# Patient Record
Sex: Female | Born: 1945
Health system: Southern US, Community
[De-identification: ages and names within clinical notes are randomized; demographics above are authoritative.]

## PROBLEM LIST (undated history)

## (undated) DIAGNOSIS — M8580 Other specified disorders of bone density and structure, unspecified site: Secondary | ICD-10-CM

## (undated) DIAGNOSIS — Z9889 Other specified postprocedural states: Secondary | ICD-10-CM

## (undated) DIAGNOSIS — D649 Anemia, unspecified: Secondary | ICD-10-CM

## (undated) DIAGNOSIS — N200 Calculus of kidney: Secondary | ICD-10-CM

## (undated) DIAGNOSIS — N898 Other specified noninflammatory disorders of vagina: Secondary | ICD-10-CM

## (undated) HISTORY — DX: Other specified noninflammatory disorders of vagina: N89.8

## (undated) HISTORY — PX: TONSILLECTOMY: SUR1361

## (undated) HISTORY — DX: Other specified disorders of bone density and structure, unspecified site: M85.80

## (undated) HISTORY — DX: Anemia, unspecified: D64.9

## (undated) HISTORY — PX: HEMORRHOID SURGERY: SHX153

## (undated) HISTORY — PX: COLONOSCOPY: SHX174

## (undated) HISTORY — PX: OTHER SURGICAL HISTORY: SHX169

## (undated) HISTORY — DX: Calculus of kidney: N20.0

## (undated) HISTORY — DX: Other specified postprocedural states: Z98.890

---

## 1993-08-30 HISTORY — PX: CARDIAC SURGERY: SHX584

## 1999-12-17 ENCOUNTER — Other Ambulatory Visit: Admission: RE | Admit: 1999-12-17 | Discharge: 1999-12-17 | Payer: Self-pay | Admitting: Family Medicine

## 2000-04-16 ENCOUNTER — Emergency Department (HOSPITAL_COMMUNITY): Admission: EM | Admit: 2000-04-16 | Discharge: 2000-04-16 | Payer: Self-pay | Admitting: Emergency Medicine

## 2000-04-16 ENCOUNTER — Encounter: Payer: Self-pay | Admitting: Emergency Medicine

## 2001-04-19 ENCOUNTER — Other Ambulatory Visit: Admission: RE | Admit: 2001-04-19 | Discharge: 2001-04-19 | Payer: Self-pay | Admitting: Family Medicine

## 2001-07-19 ENCOUNTER — Encounter: Payer: Self-pay | Admitting: Family Medicine

## 2001-07-19 ENCOUNTER — Encounter: Admission: RE | Admit: 2001-07-19 | Discharge: 2001-07-19 | Payer: Self-pay | Admitting: Family Medicine

## 2002-04-20 ENCOUNTER — Other Ambulatory Visit: Admission: RE | Admit: 2002-04-20 | Discharge: 2002-04-20 | Payer: Self-pay | Admitting: Family Medicine

## 2002-08-30 HISTORY — PX: MITRAL VALVE REPAIR: SHX2039

## 2003-05-17 ENCOUNTER — Other Ambulatory Visit: Admission: RE | Admit: 2003-05-17 | Discharge: 2003-05-17 | Payer: Self-pay | Admitting: Family Medicine

## 2004-02-28 ENCOUNTER — Emergency Department (HOSPITAL_COMMUNITY): Admission: EM | Admit: 2004-02-28 | Discharge: 2004-02-28 | Payer: Self-pay | Admitting: Emergency Medicine

## 2004-07-03 ENCOUNTER — Ambulatory Visit: Payer: Self-pay | Admitting: Family Medicine

## 2004-07-03 ENCOUNTER — Other Ambulatory Visit: Admission: RE | Admit: 2004-07-03 | Discharge: 2004-07-03 | Payer: Self-pay | Admitting: Family Medicine

## 2004-08-12 ENCOUNTER — Ambulatory Visit: Payer: Self-pay | Admitting: Family Medicine

## 2005-07-06 ENCOUNTER — Ambulatory Visit: Payer: Self-pay | Admitting: Family Medicine

## 2005-07-07 ENCOUNTER — Ambulatory Visit: Payer: Self-pay | Admitting: Family Medicine

## 2005-07-09 ENCOUNTER — Ambulatory Visit: Payer: Self-pay | Admitting: Internal Medicine

## 2005-08-10 ENCOUNTER — Ambulatory Visit: Payer: Self-pay | Admitting: Family Medicine

## 2005-08-24 ENCOUNTER — Emergency Department (HOSPITAL_COMMUNITY): Admission: EM | Admit: 2005-08-24 | Discharge: 2005-08-24 | Payer: Self-pay | Admitting: Emergency Medicine

## 2005-12-04 ENCOUNTER — Emergency Department (HOSPITAL_COMMUNITY): Admission: EM | Admit: 2005-12-04 | Discharge: 2005-12-04 | Payer: Self-pay | Admitting: Family Medicine

## 2006-09-08 ENCOUNTER — Ambulatory Visit: Payer: Self-pay | Admitting: Family Medicine

## 2006-09-08 LAB — CONVERTED CEMR LAB
AST: 24 units/L (ref 0–37)
Albumin: 4.2 g/dL (ref 3.5–5.2)
BUN: 11 mg/dL (ref 6–23)
Basophils Absolute: 0 10*3/uL (ref 0.0–0.1)
Calcium: 9.6 mg/dL (ref 8.4–10.5)
Chol/HDL Ratio, serum: 3.2
Cholesterol: 194 mg/dL (ref 0–200)
Eosinophil percent: 2.8 % (ref 0.0–5.0)
GFR calc non Af Amer: 78 mL/min
Glomerular Filtration Rate, Af Am: 94 mL/min/{1.73_m2}
Glucose, Bld: 86 mg/dL (ref 70–99)
HDL: 61.3 mg/dL (ref >39.0)
Lymphocytes Relative: 31.9 % (ref 12.0–46.0)
MCHC: 33.3 g/dL (ref 30.0–36.0)
MCV: 87.1 fL (ref 78.0–100.0)
Monocytes Relative: 8.2 % (ref 3.0–11.0)
Neutro Abs: 2.7 10*3/uL (ref 1.4–7.7)
Platelets: 147 10*3/uL — ABNORMAL LOW (ref 150–400)
Potassium: 5 meq/L (ref 3.5–5.1)
RBC: 4.49 M/uL (ref 3.87–5.11)
Triglyceride fasting, serum: 59 mg/dL (ref 0–149)

## 2006-10-04 ENCOUNTER — Ambulatory Visit: Payer: Self-pay | Admitting: Family Medicine

## 2007-09-08 DIAGNOSIS — M810 Age-related osteoporosis without current pathological fracture: Secondary | ICD-10-CM | POA: Insufficient documentation

## 2007-09-08 DIAGNOSIS — N6019 Diffuse cystic mastopathy of unspecified breast: Secondary | ICD-10-CM

## 2007-09-08 DIAGNOSIS — M8580 Other specified disorders of bone density and structure, unspecified site: Secondary | ICD-10-CM

## 2007-09-08 DIAGNOSIS — Z87442 Personal history of urinary calculi: Secondary | ICD-10-CM

## 2007-09-11 ENCOUNTER — Ambulatory Visit: Payer: Self-pay | Admitting: Family Medicine

## 2007-09-14 LAB — CONVERTED CEMR LAB
AST: 23 units/L (ref 0–37)
Bilirubin, Direct: 0.1 mg/dL (ref 0.0–0.3)
Chloride: 106 meq/L (ref 96–112)
Creatinine, Ser: 0.9 mg/dL (ref 0.4–1.2)
Direct LDL: 130.8 mg/dL
Eosinophils Absolute: 0.1 10*3/uL (ref 0.0–0.6)
Eosinophils Relative: 1.7 % (ref 0.0–5.0)
Glucose, Bld: 95 mg/dL (ref 70–99)
HCT: 40.1 % (ref 36.0–46.0)
Hemoglobin: 13.7 g/dL (ref 12.0–15.0)
MCV: 87.3 fL (ref 78.0–100.0)
Neutrophils Relative %: 58.8 % (ref 43.0–77.0)
RBC: 4.59 M/uL (ref 3.87–5.11)
RDW: 12.3 % (ref 11.5–14.6)
Sodium: 142 meq/L (ref 135–145)
Total Bilirubin: 0.8 mg/dL (ref 0.3–1.2)
Total Protein: 7.4 g/dL (ref 6.0–8.3)
Triglycerides: 69 mg/dL (ref 0–149)
WBC: 4.7 10*3/uL (ref 4.5–10.5)

## 2007-09-23 ENCOUNTER — Encounter: Payer: Self-pay | Admitting: Family Medicine

## 2007-09-28 ENCOUNTER — Encounter: Payer: Self-pay | Admitting: Family Medicine

## 2007-10-03 ENCOUNTER — Ambulatory Visit: Payer: Self-pay | Admitting: Family Medicine

## 2007-10-04 LAB — FECAL OCCULT BLOOD, GUAIAC: Fecal Occult Blood: NEGATIVE

## 2008-08-30 HISTORY — PX: OTHER SURGICAL HISTORY: SHX169

## 2008-09-12 ENCOUNTER — Encounter: Payer: Self-pay | Admitting: Family Medicine

## 2008-09-12 ENCOUNTER — Other Ambulatory Visit: Admission: RE | Admit: 2008-09-12 | Discharge: 2008-09-12 | Payer: Self-pay | Admitting: Family Medicine

## 2008-09-12 ENCOUNTER — Ambulatory Visit: Payer: Self-pay | Admitting: Family Medicine

## 2008-09-12 DIAGNOSIS — E785 Hyperlipidemia, unspecified: Secondary | ICD-10-CM

## 2008-09-16 ENCOUNTER — Encounter (INDEPENDENT_AMBULATORY_CARE_PROVIDER_SITE_OTHER): Payer: Self-pay | Admitting: *Deleted

## 2008-09-16 ENCOUNTER — Encounter: Payer: Self-pay | Admitting: Family Medicine

## 2008-09-16 LAB — CONVERTED CEMR LAB
AST: 27 units/L (ref 0–37)
Albumin: 4.5 g/dL (ref 3.5–5.2)
BUN: 14 mg/dL (ref 6–23)
Basophils Relative: 0.2 % (ref 0.0–3.0)
Chloride: 107 meq/L (ref 96–112)
Creatinine, Ser: 0.8 mg/dL (ref 0.4–1.2)
Direct LDL: 120.8 mg/dL
Eosinophils Absolute: 0.1 10*3/uL (ref 0.0–0.7)
Eosinophils Relative: 1.7 % (ref 0.0–5.0)
GFR calc non Af Amer: 77 mL/min
HCT: 40.5 % (ref 36.0–46.0)
MCV: 89.5 fL (ref 78.0–100.0)
Monocytes Absolute: 0.4 10*3/uL (ref 0.1–1.0)
Neutrophils Relative %: 59.4 % (ref 43.0–77.0)
RBC: 4.52 M/uL (ref 3.87–5.11)
WBC: 5.3 10*3/uL (ref 4.5–10.5)

## 2008-09-19 ENCOUNTER — Encounter (INDEPENDENT_AMBULATORY_CARE_PROVIDER_SITE_OTHER): Payer: Self-pay | Admitting: *Deleted

## 2008-09-21 ENCOUNTER — Encounter: Payer: Self-pay | Admitting: Family Medicine

## 2008-09-25 ENCOUNTER — Encounter (INDEPENDENT_AMBULATORY_CARE_PROVIDER_SITE_OTHER): Payer: Self-pay | Admitting: *Deleted

## 2008-09-27 ENCOUNTER — Ambulatory Visit: Payer: Self-pay | Admitting: Gastroenterology

## 2008-10-11 ENCOUNTER — Ambulatory Visit: Payer: Self-pay | Admitting: Gastroenterology

## 2009-01-08 ENCOUNTER — Ambulatory Visit: Payer: Self-pay | Admitting: Family Medicine

## 2009-01-13 LAB — CONVERTED CEMR LAB
ALT: 19 units/L (ref 0–35)
AST: 24 units/L (ref 0–37)
Cholesterol: 183 mg/dL (ref 0–200)
LDL Cholesterol: 112 mg/dL — ABNORMAL HIGH (ref 0–99)
Total CHOL/HDL Ratio: 3

## 2009-03-08 ENCOUNTER — Emergency Department (HOSPITAL_COMMUNITY): Admission: EM | Admit: 2009-03-08 | Discharge: 2009-03-08 | Payer: Self-pay | Admitting: Emergency Medicine

## 2009-08-28 ENCOUNTER — Encounter: Payer: Self-pay | Admitting: Family Medicine

## 2009-09-11 ENCOUNTER — Telehealth (INDEPENDENT_AMBULATORY_CARE_PROVIDER_SITE_OTHER): Payer: Self-pay | Admitting: *Deleted

## 2009-09-12 ENCOUNTER — Ambulatory Visit: Payer: Self-pay | Admitting: Family Medicine

## 2009-09-14 LAB — CONVERTED CEMR LAB
ALT: 16 units/L (ref 0–35)
AST: 21 units/L (ref 0–37)
Albumin: 4.3 g/dL (ref 3.5–5.2)
Alkaline Phosphatase: 44 units/L (ref 39–117)
Calcium: 8.8 mg/dL (ref 8.4–10.5)
Chloride: 107 meq/L (ref 96–112)
Cholesterol: 172 mg/dL (ref 0–200)
Creatinine, Ser: 0.81 mg/dL (ref 0.40–1.20)
HDL: 61 mg/dL (ref >39)
Total Protein: 7 g/dL (ref 6.0–8.3)
Triglycerides: 60 mg/dL (ref <150)

## 2009-09-15 LAB — CONVERTED CEMR LAB
Basophils Relative: 0.1 % (ref 0.0–3.0)
Eosinophils Absolute: 0.1 10*3/uL (ref 0.0–0.7)
HCT: 40.1 % (ref 36.0–46.0)
Hemoglobin: 13 g/dL (ref 12.0–15.0)
Lymphs Abs: 1.6 10*3/uL (ref 0.7–4.0)
MCHC: 32.5 g/dL (ref 30.0–36.0)
MCV: 90.6 fL (ref 78.0–100.0)
Monocytes Absolute: 0.3 10*3/uL (ref 0.1–1.0)
Neutro Abs: 2.6 10*3/uL (ref 1.4–7.7)
RBC: 4.42 M/uL (ref 3.87–5.11)
Vit D, 25-Hydroxy: 19 ng/mL — ABNORMAL LOW (ref 30–89)

## 2009-09-23 ENCOUNTER — Ambulatory Visit: Payer: Self-pay | Admitting: Family Medicine

## 2009-09-23 DIAGNOSIS — D696 Thrombocytopenia, unspecified: Secondary | ICD-10-CM

## 2009-09-23 DIAGNOSIS — E559 Vitamin D deficiency, unspecified: Secondary | ICD-10-CM

## 2009-09-24 ENCOUNTER — Ambulatory Visit: Payer: Self-pay | Admitting: Oncology

## 2009-09-30 ENCOUNTER — Encounter: Payer: Self-pay | Admitting: Family Medicine

## 2009-09-30 LAB — LACTATE DEHYDROGENASE: LDH: 151 U/L (ref 94–250)

## 2009-09-30 LAB — CBC WITH DIFFERENTIAL (CANCER CENTER ONLY)
Eosinophils Absolute: 0.1 10*3/uL (ref 0.0–0.5)
HCT: 40.3 % (ref 34.8–46.6)
LYMPH#: 1.9 10*3/uL (ref 0.9–3.3)
LYMPH%: 32.6 % (ref 14.0–48.0)
MCV: 87 fL (ref 81–101)
MONO#: 0.4 10*3/uL (ref 0.1–0.9)
Platelets: 144 10*3/uL — ABNORMAL LOW (ref 145–400)
RBC: 4.65 10*6/uL (ref 3.70–5.32)
WBC: 5.8 10*3/uL (ref 3.9–10.0)

## 2009-09-30 LAB — FERRITIN: Ferritin: 32 ng/mL (ref 10–291)

## 2009-09-30 LAB — CMP (CANCER CENTER ONLY)
ALT(SGPT): 21 U/L (ref 10–47)
AST: 23 U/L (ref 11–38)
Alkaline Phosphatase: 56 U/L (ref 26–84)
CO2: 30 mEq/L (ref 18–33)
Creat: 0.9 mg/dl (ref 0.6–1.2)
Sodium: 140 mEq/L (ref 128–145)
Total Bilirubin: 0.5 mg/dl (ref 0.20–1.60)
Total Protein: 7.5 g/dL (ref 6.4–8.1)

## 2009-09-30 LAB — MORPHOLOGY - CHCC SATELLITE
PLT EST ~~LOC~~: ADEQUATE
Platelet Morphology: NORMAL
RBC Comments: NORMAL

## 2009-09-30 LAB — IRON AND TIBC: Iron: 84 ug/dL (ref 42–145)

## 2009-10-04 ENCOUNTER — Encounter: Payer: Self-pay | Admitting: Family Medicine

## 2009-10-04 LAB — HM MAMMOGRAPHY: HM Mammogram: NORMAL

## 2009-10-10 ENCOUNTER — Encounter (INDEPENDENT_AMBULATORY_CARE_PROVIDER_SITE_OTHER): Payer: Self-pay | Admitting: *Deleted

## 2009-12-15 ENCOUNTER — Ambulatory Visit: Payer: Self-pay | Admitting: Family Medicine

## 2009-12-16 ENCOUNTER — Encounter: Payer: Self-pay | Admitting: Family Medicine

## 2009-12-26 ENCOUNTER — Ambulatory Visit: Payer: Self-pay | Admitting: Oncology

## 2009-12-31 ENCOUNTER — Encounter: Payer: Self-pay | Admitting: Family Medicine

## 2009-12-31 LAB — CBC WITH DIFFERENTIAL (CANCER CENTER ONLY)
BASO#: 0 10*3/uL (ref 0.0–0.2)
Eosinophils Absolute: 0.1 10*3/uL (ref 0.0–0.5)
HCT: 38.6 % (ref 34.8–46.6)
HGB: 13.1 g/dL (ref 11.6–15.9)
LYMPH%: 36.9 % (ref 14.0–48.0)
MCH: 28.8 pg (ref 26.0–34.0)
MCV: 85 fL (ref 81–101)
MONO%: 6 % (ref 0.0–13.0)
RBC: 4.55 10*6/uL (ref 3.70–5.32)

## 2009-12-31 LAB — MORPHOLOGY - CHCC SATELLITE

## 2010-02-03 ENCOUNTER — Telehealth: Payer: Self-pay | Admitting: Internal Medicine

## 2010-03-27 ENCOUNTER — Ambulatory Visit: Payer: Self-pay | Admitting: Oncology

## 2010-04-02 LAB — CBC WITH DIFFERENTIAL (CANCER CENTER ONLY)
BASO#: 0 10*3/uL (ref 0.0–0.2)
Eosinophils Absolute: 0.3 10*3/uL (ref 0.0–0.5)
HGB: 12.4 g/dL (ref 11.6–15.9)
LYMPH#: 1.5 10*3/uL (ref 0.9–3.3)
MCH: 29.2 pg (ref 26.0–34.0)
MONO#: 0.4 10*3/uL (ref 0.1–0.9)
NEUT#: 3.3 10*3/uL (ref 1.5–6.5)
RBC: 4.25 10*6/uL (ref 3.70–5.32)
WBC: 5.4 10*3/uL (ref 3.9–10.0)

## 2010-06-26 ENCOUNTER — Ambulatory Visit: Payer: Self-pay | Admitting: Oncology

## 2010-07-01 ENCOUNTER — Encounter: Payer: Self-pay | Admitting: Family Medicine

## 2010-07-01 LAB — CBC WITH DIFFERENTIAL (CANCER CENTER ONLY)
BASO#: 0 10*3/uL (ref 0.0–0.2)
BASO%: 0.7 % (ref 0.0–2.0)
EOS%: 2.1 % (ref 0.0–7.0)
HCT: 39 % (ref 34.8–46.6)
HGB: 13.1 g/dL (ref 11.6–15.9)
LYMPH%: 30.1 % (ref 14.0–48.0)
MCH: 28.6 pg (ref 26.0–34.0)
MCHC: 33.6 g/dL (ref 32.0–36.0)
MCV: 85 fL (ref 81–101)
NEUT%: 59.1 % (ref 39.6–80.0)
RDW: 12.8 % (ref 10.5–14.6)

## 2010-07-01 LAB — MORPHOLOGY - CHCC SATELLITE: PLT EST ~~LOC~~: ADEQUATE

## 2010-07-01 LAB — CHCC SATELLITE - SMEAR

## 2010-09-21 ENCOUNTER — Telehealth (INDEPENDENT_AMBULATORY_CARE_PROVIDER_SITE_OTHER): Payer: Self-pay | Admitting: *Deleted

## 2010-09-22 ENCOUNTER — Other Ambulatory Visit: Payer: Self-pay | Admitting: Family Medicine

## 2010-09-22 ENCOUNTER — Encounter: Payer: Self-pay | Admitting: Family Medicine

## 2010-09-22 ENCOUNTER — Ambulatory Visit
Admission: RE | Admit: 2010-09-22 | Discharge: 2010-09-22 | Payer: Self-pay | Source: Home / Self Care | Attending: Family Medicine | Admitting: Family Medicine

## 2010-09-22 LAB — BASIC METABOLIC PANEL
BUN: 15 mg/dL (ref 6–23)
CO2: 29 mEq/L (ref 19–32)
Calcium: 9.2 mg/dL (ref 8.4–10.5)
Chloride: 107 mEq/L (ref 96–112)
Creatinine, Ser: 0.9 mg/dL (ref 0.4–1.2)
GFR: 64.42 mL/min (ref >60.00)
Glucose, Bld: 92 mg/dL (ref 70–99)
Potassium: 4.9 mEq/L (ref 3.5–5.1)
Sodium: 141 mEq/L (ref 135–145)

## 2010-09-22 LAB — CBC WITH DIFFERENTIAL/PLATELET
Basophils Absolute: 0 10*3/uL (ref 0.0–0.1)
Eosinophils Relative: 1.6 % (ref 0.0–5.0)
HCT: 39.4 % (ref 36.0–46.0)
Lymphs Abs: 1.8 10*3/uL (ref 0.7–4.0)
MCV: 89.1 fl (ref 78.0–100.0)
Monocytes Absolute: 0.4 10*3/uL (ref 0.1–1.0)
Neutrophils Relative %: 52.3 % (ref 43.0–77.0)
Platelets: 146 10*3/uL — ABNORMAL LOW (ref 150.0–400.0)
RDW: 13.7 % (ref 11.5–14.6)
WBC: 4.9 10*3/uL (ref 4.5–10.5)

## 2010-09-22 LAB — LIPID PANEL
Cholesterol: 205 mg/dL — ABNORMAL HIGH (ref 0–200)
HDL: 60.8 mg/dL (ref >39.00)
Total CHOL/HDL Ratio: 3
Triglycerides: 88 mg/dL (ref 0.0–149.0)
VLDL: 17.6 mg/dL (ref 0.0–40.0)

## 2010-09-22 LAB — HEPATIC FUNCTION PANEL
Bilirubin, Direct: 0.1 mg/dL (ref 0.0–0.3)
Total Bilirubin: 0.5 mg/dL (ref 0.3–1.2)

## 2010-09-22 LAB — TSH: TSH: 1.2 u[IU]/mL (ref 0.35–5.50)

## 2010-09-22 LAB — LDL CHOLESTEROL, DIRECT: Direct LDL: 127.8 mg/dL

## 2010-09-23 LAB — CONVERTED CEMR LAB: Vit D, 25-Hydroxy: 30 ng/mL (ref 30–89)

## 2010-09-25 ENCOUNTER — Ambulatory Visit
Admission: RE | Admit: 2010-09-25 | Discharge: 2010-09-25 | Payer: Self-pay | Source: Home / Self Care | Attending: Family Medicine | Admitting: Family Medicine

## 2010-09-29 NOTE — Miscellaneous (Signed)
Summary: Vitamin D 2000iu  Clinical Lists Changes  Medications: Removed medication of ERGOCALCIFEROL 50000 UNIT CAPS (ERGOCALCIFEROL) 1 by mouth each week for 12 weeks Added new medication of VITAMIN D 2000 UNIT TABS (CHOLECALCIFEROL) Take 1 tablet by mouth once a day     Current Allergies: No known allergies

## 2010-09-29 NOTE — Consult Note (Signed)
[facility]   Imported By: Lanelle Bal 10/07/2009 12:56:09  _____________________________________________________________________  External Attachment:    Type:   Image     Comment:   External Document

## 2010-09-29 NOTE — Assessment & Plan Note (Signed)
Summary: Complete Physical   Vital Signs:  Patient profile:   65 year old female Height:      61.5 inches Weight:      130 pounds BMI:     24.25 Temp:     97.7 degrees F oral Pulse rate:   80 / minute Pulse rhythm:   regular BP sitting:   128 / 70  (left arm) Cuff size:   regular  Vitals Entered By: Lowella Petties CMA (September 23, 2009 11:06 AM) CC: 30 minute check up   History of Present Illness: here for health mt exam and to rev chronic med problems  is doing well as usual   had a dog bite this year - from her own dog  had to have a skin graft  got a Td in hospital  is healing well    wt is down 3 lb  bp is 128/76-- good   hx of anemia - and that ok but low platelets  no easy bruising or bleeding  was 135 last year- now 118  she takes asa every other day   lipids good trig 60, wiht HDL 61  and LDL 99 -- very good  takes flax seed oil    Osteopenia imp at last dexa 1 y ago ca and D - is not good about it  D level is low at 19   colonosc utd 2/10-- 5 y f/u   pap nl 1/10 no hx of abn ones  no gyn symptoms and her vaginal cyst is not changed   mam 1/10-- she will schedule her own self exam  no lumps   Td 7/10  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    Allergies: No Known Drug Allergies  Past History:  Past Medical History: Last updated: 10/14/2008 Anemia-NOS Nephrolithiasis, hx of Osteoporosis vaginal cyst- stable hx of mitral valve repair-- proph for dentist    cardiol- [facility]  Family History: Last updated: 09/11/2007 Father:  Mother: abnormal colon polyp Siblings: brother with DM uncle with prostate CA aunt sudden death  Social History: Last updated: 09/12/2008 Marital Status: Married Children: 3 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  Risk Factors: Smoking Status: never (09/08/2007)  Past Surgical History: Heart surgery- mitral valve repair- carpenter's ring (1995) Lithotripsy for kidney  stones Tonsillectomy Surgery for ruptured ovarian cyst Old records state 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 Eyes:  Denies blurring and eye pain. ENT:  Complains of nosebleeds. CV:  Denies chest pain or discomfort, lightheadness, and palpitations. Resp:  Denies cough and wheezing. GI:  Denies abdominal pain, bloody stools, change in bowel habits, indigestion, nausea, and vomiting. GU:  Denies abnormal vaginal bleeding, discharge, and dysuria. MS:  Denies joint pain and muscle aches. Derm:  Denies itching, lesion(s), poor wound healing, and rash. Neuro:  Denies headaches, numbness, and tingling. Psych:  Denies anxiety and depression. Endo:  Denies cold intolerance, excessive thirst, excessive urination, and heat intolerance. Heme:  Denies abnormal bruising, bleeding, enlarge lymph nodes, pallor, and skin discoloration.  Physical Exam  General:  Well-developed,well-nourished,in no acute distress; alert,appropriate  and cooperative throughout examination Head:  normocephalic, atraumatic, and no abnormalities observed.   Eyes:  vision grossly intact, pupils equal, pupils round, and pupils reactive to light.   Neck:  supple with full rom and no masses or thyromegally, no JVD or carotid bruit  Breasts:  No mass, nodules, thickening, tenderness, bulging, retraction, inflamation, nipple discharge or skin changes noted.   Lungs:  Normal respiratory effort, chest expands symmetrically. Lungs are clear to auscultation, no crackles or  wheezes. Heart:  Normal rate and regular rhythm. S1 and S2 normal without gallop, murmur, click, rub or other extra sounds. Abdomen:  Bowel sounds positive,abdomen soft and non-tender without masses, organomegaly or hernias noted. no renal bruits  Msk:  No deformity or scoliosis noted of thoracic or lumbar spine.  no acute joint changes  Pulses:  R and L carotid,radial,femoral,dorsalis pedis and posterior tibial pulses are full and equal bilaterally Extremities:  No clubbing, cyanosis, edema, or deformity noted with normal full range of motion of all joints.   Neurologic:  sensation intact to light touch, gait normal, and DTRs symmetrical and normal.   Skin:  Intact without suspicious lesions or rashes fair with some lentigos no bruising or petichae Cervical Nodes:  No lymphadenopathy noted Axillary Nodes:  No palpable lymphadenopathy Inguinal Nodes:  No significant adenopathy Psych:  normal affect, talkative and pleasant    Impression & Recommendations:  Problem # 1:  HEALTH MAINTENANCE EXAM (ICD-V70.0) Assessment Comment Only reviewed health habits including diet, exercise and skin cancer prevention reviewed health maintenance list and family history  rev labs  urged to keep up good habits   Problem # 2:  UNSPECIFIED VITAMIN D DEFICIENCY (ICD-268.9) Assessment: New will tx with high dose weekly D for 12 weeks and re check also 1000 international units otc daily stressed imp for bone health  Problem # 3:  THROMBOCYTOPENIA (ICD-287.5) Assessment: New  new with platelet ct 118 and no symptoms  ref to heme   Orders: Hematology Referral (Hematology)  Problem # 4:  OSTEOPOROSIS (ICD-733.00) Assessment: Unchanged dexa utd repl D  fosamax refil  Her updated medication list for this problem includes:    Fosamax 70 Mg Tabs (Alendronate sodium) .Marland Kitchen... 1 by mouth q week as directed    Ergocalciferol 50000 Unit Caps (Ergocalciferol) .Marland Kitchen... 1 by mouth each week for 12  weeks  Complete Medication List: 1)  Fosamax 70 Mg Tabs (Alendronate sodium) .Marland Kitchen.. 1 by mouth q week as directed 2)  Aspirin 325 Mg Tabs (Aspirin) .... One by mouth qod 3)  Flax Seed Tabs  .... One by mouth daily 4)  Amoxicillin 500 Mg Caps (Amoxicillin) .... 4 pills by mouth times one thirty minutes before dentist appt 5)  Ergocalciferol 50000 Unit Caps (Ergocalciferol) .Marland Kitchen.. 1 by mouth each week for 12 weeks  Patient Instructions: 1)  the current recommendation for calcium intake is 1200-1500 mg daily with 1000 IU of vitamin D  2)  take px vit D as directed  3)  schedule non fasting labs in 12 weeks to check vit D level  4)  we will refer you to hematology for platelet problem at check out  5)  make sure to schedule your annual mammogram  6)  flu shot  Prescriptions: FOSAMAX 70 MG TABS (ALENDRONATE SODIUM) 1 by mouth q week as directed  #4 x 11   Entered and Authorized by:   Judith Part MD   Signed by:   Judith Part MD on 09/23/2009  Method used:   Print then Give to Patient   RxID:   [id number] ERGOCALCIFEROL 50000 UNIT CAPS (ERGOCALCIFEROL) 1 by mouth each week for 12 weeks  #12 x 0   Entered and Authorized by:   Judith Part MD   Signed by:   Judith Part MD on 09/23/2009   Method used:   Print then Give to Patient   RxID:   [id number]   Prior Medications (reviewed today): FOSAMAX 70 MG TABS (ALENDRONATE SODIUM) 1 by mouth q week as directed ASPIRIN 325 MG  TABS (ASPIRIN) one by mouth qod FLAX SEED TABS () one by mouth daily AMOXICILLIN 500 MG CAPS (AMOXICILLIN) 4 pills by mouth times one thirty minutes before dentist appt ERGOCALCIFEROL 50000 UNIT CAPS (ERGOCALCIFEROL) 1 by mouth each week for 12 weeks Current Allergies: No known allergies      Tetanus/Td Immunization History:    Tetanus/Td # 1:  Td (02/27/2009)

## 2010-09-29 NOTE — Miscellaneous (Signed)
  Clinical Lists Changes  Observations: Added new observation of MAMMO DUE: 10/04/2010 (10/04/2009 9:00) Added new observation of MAMMOGRAM: Normal (10/04/2009 9:00)

## 2010-09-29 NOTE — Letter (Signed)
Summary: [facility]  [facility]   Imported By: Lanelle Bal 09/02/2009 10:01:00  _____________________________________________________________________  External Attachment:    Type:   Image     Comment:   External Document

## 2010-09-29 NOTE — Progress Notes (Signed)
Summary: lab orders for 09/12/09 appt  ---- Converted from flag ---- ---- 09/11/2009 8:22 AM, Judith Part MD wrote: please check wellness prof/ lipids and vit D level - thanks   ---- 09/11/2009 8:18 AM, Liane Comber CMA (AAMA) wrote: Pt is scheduled for cpx labs tomorrow, what labs to draw and dx codes? Thanks Tasha ------------------------------

## 2010-09-29 NOTE — Letter (Signed)
[facility]   Imported By: Lanelle Bal 01/08/2010 14:10:07  _____________________________________________________________________  External Attachment:    Type:   Image     Comment:   External Document

## 2010-09-29 NOTE — Letter (Signed)
Summary: [facility]  [facility]   Imported By: Lanelle Bal 07/13/2010 12:10:07  _____________________________________________________________________  External Attachment:    Type:   Image     Comment:   External Document

## 2010-09-29 NOTE — Letter (Signed)
Summary: Results Follow up Letter  [place] [facility]  [address] [place], [place] [address]   Phone: [phone]  Fax: [phone]    10/10/2009 MRN: [id number]    DHANVI BOESEN [address] [place] [place], [place]  [address]    Dear Ms. Volcy,  The following are the results of your recent test(s):  Test         Result    Pap Smear:        Normal _____  Not Normal _____ Comments: ______________________________________________________ Cholesterol: LDL(Bad cholesterol):         Your goal is less than:         HDL (Good cholesterol):       Your goal is more than: Comments:  ______________________________________________________ Mammogram:        Normal __X___  Not Normal _____ Comments:  Yearly follow up is recommended.   ___________________________________________________________________ Hemoccult:        Normal _____  Not normal _______ Comments:    _____________________________________________________________________ Other Tests:    We routinely do not discuss normal results over the telephone.  If you desire a copy of the results, or you have any questions about this information we can discuss them at your next office visit.   Sincerely,   Marne A. Milinda Antis, M.D.  MAT:lsf

## 2010-09-29 NOTE — Progress Notes (Signed)
Summary: Amoxicillin for dental work  Phone Note Refill Request Message from:  [employer] on February 03, 2010 10:24 AM  Refills Requested: Medication #1:  AMOXICILLIN 500 MG CAPS 4 pills by mouth times one thirty minutes before dentist appt [facility]  [address]  [phone]*   Last Fill Date:  No date sent   Pharmacy Phone:  [phone]   Method Requested: Electronic Initial call taken by: Delilah Shan CMA Duncan Dull),  February 03, 2010 10:24 AM  Follow-up for Phone Call        okay to refill #4 x 1 refill Follow-up by: Cindee Salt MD,  February 03, 2010 11:13 AM  Additional Follow-up for Phone Call Additional follow up Details #1::        Rx faxed to pharmacy Additional Follow-up by: DeShannon Smith CMA Duncan Dull),  February 03, 2010 2:35 PM    Prescriptions: AMOXICILLIN 500 MG CAPS (AMOXICILLIN) 4 pills by mouth times one thirty minutes before dentist appt  #4 x 1   Entered by:   Mervin Hack CMA (AAMA)   Authorized by:   Cindee Salt MD   Signed by:   Mervin Hack CMA (AAMA) on 02/03/2010   Method used:   Electronically to        [facility]  [phone]* (retail)       [address] [place], [place]  [address]       Ph: [phone] or [phone]       Fax: [phone]   RxID:   [id number]

## 2010-10-01 NOTE — Progress Notes (Signed)
----   Converted from flag ---- ---- 09/20/2010 9:34 AM, Colon Flattery Tower MD wrote: please check wellness and lipid and D level  v70.0 and 272 and D deficiency and 733.0 thanks  ---- 09/18/2010 11:36 AM, Liane Comber CMA (AAMA) wrote: Lab orders please! Good Morning! This pt is scheduled for cpx labs Tuesday, which labs to draw and dx codes to use? Thanks Tasha ------------------------------

## 2010-10-01 NOTE — Assessment & Plan Note (Signed)
Summary: CPX/CLE   Vital Signs:  Patient profile:   65 year old female Height:      62.5 inches Weight:      137.25 pounds BMI:     24.79 Temp:     97.9 degrees F oral Pulse rate:   84 / minute Pulse rhythm:   regular BP sitting:   116 / 68  (left arm) Cuff size:   regular  Vitals Entered By: Lewanda Rife LPN (September 25, 2010 10:16 AM) CC: CPX   History of Present Illness: here for health mt exam and to review chronic med problems has been feeling ok- no new health problems   wt is up 7lb with bmi of 24  116/68 bp   osteopenia - dexa 1/10 improved vit /d level down a bit at 30  - takes 2000 international units daily  takes calcium gets some exercise  on her toes - 3 toddlers in her house  is very active  ? 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 Cardiac cath-(1994) Hemorrhoidectomy Left breast aspiration Cyst- vaginal vault, stable (12/1999) Dexa0 osteoporosis (04/2001),  improved (05/2003), stable (06/2005) Mitral valve replacement (07/2003) dexa 1/10 osteopenia- improved  2/10 colonoscopy diverticulosis - re check 5 years (due to fam hx) skin graft from dog bite R arm 2010  Family History: Last updated: 09/11/2007 Father:  Mother: abnormal colon polyp Siblings: brother with DM uncle with prostate CA aunt sudden death  Social History: Last  updated: 09/12/2008 Marital Status: Married Children: 3 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 Eyes:  Denies blurring and eye irritation. CV:  Denies chest pain or discomfort, lightheadness, and palpitations. Resp:  Denies cough, shortness of breath, and wheezing. GI:  Denies abdominal pain, change in bowel habits, indigestion, and nausea. GU:  Denies abnormal vaginal bleeding, discharge, dysuria, and urinary frequency. MS:  Denies joint pain, joint redness, joint swelling, and stiffness. Derm:  Denies itching, lesion(s), poor wound healing, and rash. Neuro:  Denies numbness and tingling. Psych:  Denies anxiety and depression. Endo:  Denies cold intolerance, excessive thirst, excessive urination, and heat intolerance. Heme:  Denies abnormal bruising and bleeding.  Physical Exam  General:  Well-developed,well-nourished,in no acute distress; alert,appropriate and cooperative throughout examination Head:  normocephalic, atraumatic, and no abnormalities observed.   Eyes:  vision grossly intact, pupils equal, pupils round, and pupils reactive to light.  no conjunctival pallor, injection or icterus  Ears:  R ear normal and L ear normal.   Nose:  no nasal discharge.   Mouth:  pharynx pink and moist.   Neck:  supple with full rom and no masses or thyromegally, no JVD or carotid bruit  Chest Wall:  No deformities, masses, or tenderness noted. Breasts:  No mass, nodules, thickening, tenderness, bulging, retraction, inflamation, nipple discharge or skin changes noted.   Lungs:  Normal respiratory effort, chest expands symmetrically. Lungs are clear to  auscultation, no crackles or wheezes. Heart:  RRR with baseline M Abdomen:  Bowel sounds positive,abdomen soft and non-tender without masses, organomegaly or hernias noted. no renal bruits  Msk:  no kyphosis petite frame  Pulses:  R and L carotid,radial,femoral,dorsalis pedis and posterior tibial pulses are full and equal bilaterally Extremities:  No clubbing, cyanosis, edema, or deformity noted with normal full range of motion of all joints.   Neurologic:  sensation intact to light touch, gait normal, and DTRs symmetrical and normal.   Skin:  bruise under R 1st toenail - pt describes trauma  Cervical Nodes:  No lymphadenopathy noted Axillary Nodes:  No palpable lymphadenopathy Inguinal Nodes:  No significant adenopathy Psych:  normal affect, talkative and pleasant    Impression & Recommendations:  Problem # 1:  HEALTH MAINTENANCE EXAM (ICD-V70.0) Assessment Comment Only reviewed health habits including diet, exercise and skin cancer prevention reviewed health maintenance list and family history labs rev in detail with pt   Problem # 2:  UNSPECIFIED VITAMIN D DEFICIENCY (ICD-268.9) Assessment: Deteriorated vit D down a bit inc otc to 4000 international units daily  re check next year  Problem # 3:  OTHER SCREENING MAMMOGRAM (ICD-V76.12) Assessment: Comment Only pt will schedule her own  nl exam today enc regular self exams  Problem # 4:  HYPERLIPIDEMIA (ICD-272.4) Assessment: Deteriorated  labs show inc LDL rev low sat fat diet to follow keep up activity  Labs Reviewed: SGOT: 25 (09/22/2010)   SGPT: 23 (09/22/2010)   HDL:60.80 (09/22/2010), 61 (09/12/2009)  LDL:99 (09/12/2009), 112 (16/05/9603)  Chol:205 (09/22/2010), 172 (09/12/2009)  Trig:88.0 (09/22/2010), 60 (09/12/2009)  Problem # 5:  OSTEOPOROSIS (ICD-733.00) Assessment: Unchanged on fosamax over 5 years- adv to stop it  continue ca and D pt will schedule her 2 y dexa  The following medications were removed  from the medication list:    Fosamax 70 Mg Tabs (Alendronate sodium) .Marland Kitchen... 1 by mouth q week as directed Her updated medication list for this problem includes:    Vitamin D 2000 Unit Tabs (Cholecalciferol) .Marland Kitchen... Take 2  tablet by mouth once a day  Complete Medication List: 1)  Aspirin 325 Mg Tabs (Aspirin) .... One by mouth qod 2)  Amoxicillin 500 Mg Caps (Amoxicillin) .... 4 pills by mouth times one thirty minutes before dentist appt 3)  Vitamin D 2000 Unit Tabs (Cholecalciferol) .... Take 2  tablet by mouth once a day 4)  Flax Seed Oil 1000 Mg Caps (Flaxseed (linseed)) .... Take 1 capsule by mouth once a day  Patient Instructions: 1)  please increase your vit D3 to 4000 international units daily total  2)  don't forget to schedule your annual mammogram and bone density test  3)  you can raise your HDL (good cholesterol) by increasing exercise and eating omega 3 fatty acid supplement like fish oil or flax seed oil over the counter 4)  you can lower LDL (bad cholesterol) by limiting saturated fats in diet like red meat, fried foods, egg yolks, fatty breakfast meats, high fat dairy products and shellfish  5)  get a flu  shot at a pharmacy since we were out of them  6)  if you are interested in a shingles vacine - check with insurance and then call us to schedule  7)  please pull patient's old chart and put it on my desk- thanks    Orders Added: 1)  Est. Patient 40-64 years [phone]    Current Allergies (reviewed today): No known allergies

## 2010-12-11 ENCOUNTER — Telehealth: Payer: Self-pay | Admitting: *Deleted

## 2010-12-11 NOTE — Telephone Encounter (Signed)
Pt states she has had cough and cold symptoms. Had this about 2 weeks ago, improved, but now it has come back.  She started taking tylenol cold and feels pretty good today.  She will see how she does and if not good tomorrow she will go to urgent care or Saturday clinic.

## 2010-12-11 NOTE — Telephone Encounter (Signed)
Patient notified as instructed by telephone. 

## 2010-12-11 NOTE — Telephone Encounter (Signed)
I agree- needs to follow up if not improved  If worse - seek care now

## 2010-12-25 LAB — HM MAMMOGRAPHY: HM Mammogram: NEGATIVE

## 2010-12-25 LAB — HM DEXA SCAN

## 2011-02-11 ENCOUNTER — Telehealth: Payer: Self-pay

## 2011-02-11 NOTE — Telephone Encounter (Signed)
Could not add dexa without opening phone call would not let me do quick abstraction or override in health maintenance.

## 2011-02-15 ENCOUNTER — Other Ambulatory Visit: Payer: Self-pay | Admitting: *Deleted

## 2011-02-15 MED ORDER — AMOXICILLIN 500 MG PO CAPS: ORAL_CAPSULE | ORAL | Status: DC

## 2011-02-15 NOTE — Telephone Encounter (Signed)
Rx called to pharmacy

## 2011-02-15 NOTE — Telephone Encounter (Signed)
Px written for call in   

## 2011-10-06 ENCOUNTER — Telehealth: Payer: Self-pay | Admitting: Family Medicine

## 2011-10-06 ENCOUNTER — Other Ambulatory Visit (INDEPENDENT_AMBULATORY_CARE_PROVIDER_SITE_OTHER): Payer: 59

## 2011-10-06 DIAGNOSIS — E559 Vitamin D deficiency, unspecified: Secondary | ICD-10-CM

## 2011-10-06 DIAGNOSIS — M810 Age-related osteoporosis without current pathological fracture: Secondary | ICD-10-CM

## 2011-10-06 DIAGNOSIS — E785 Hyperlipidemia, unspecified: Secondary | ICD-10-CM

## 2011-10-06 DIAGNOSIS — D696 Thrombocytopenia, unspecified: Secondary | ICD-10-CM

## 2011-10-06 LAB — CBC WITH DIFFERENTIAL/PLATELET
Basophils Absolute: 0 10*3/uL (ref 0.0–0.1)
Eosinophils Absolute: 0.1 10*3/uL (ref 0.0–0.7)
Lymphocytes Relative: 36.8 % (ref 12.0–46.0)
MCHC: 33.8 g/dL (ref 30.0–36.0)
Monocytes Relative: 8.7 % (ref 3.0–12.0)
Neutrophils Relative %: 52.3 % (ref 43.0–77.0)
Platelets: 137 10*3/uL — ABNORMAL LOW (ref 150.0–400.0)
RDW: 13.3 % (ref 11.5–14.6)

## 2011-10-06 LAB — LIPID PANEL
HDL: 68.6 mg/dL (ref >39.00)
Total CHOL/HDL Ratio: 3
VLDL: 15.8 mg/dL (ref 0.0–40.0)

## 2011-10-06 LAB — COMPREHENSIVE METABOLIC PANEL
ALT: 21 U/L (ref 0–35)
AST: 25 U/L (ref 0–37)
CO2: 27 mEq/L (ref 19–32)
Calcium: 9.5 mg/dL (ref 8.4–10.5)
Chloride: 107 mEq/L (ref 96–112)
Creatinine, Ser: 1 mg/dL (ref 0.4–1.2)
GFR: 59.75 mL/min — ABNORMAL LOW (ref >60.00)
Sodium: 141 mEq/L (ref 135–145)
Total Protein: 7.4 g/dL (ref 6.0–8.3)

## 2011-10-06 NOTE — Telephone Encounter (Signed)
Message copied by Judy Pimple on Wed Oct 06, 2011  8:52 AM ------      Message from: Alvina Chou      Created: Tue Sep 28, 2011  3:26 PM      Regarding: Lab orders for 2.6.13       Patient is scheduled for CPX labs, please order future labs, Thanks , Camelia Eng

## 2011-10-13 ENCOUNTER — Other Ambulatory Visit (HOSPITAL_COMMUNITY)
Admission: RE | Admit: 2011-10-13 | Discharge: 2011-10-13 | Disposition: A | Discharge status: Home / Self Care | Payer: 59 | Source: Ambulatory Visit | Attending: Family Medicine | Admitting: Family Medicine

## 2011-10-13 ENCOUNTER — Encounter: Payer: Self-pay | Admitting: Family Medicine

## 2011-10-13 ENCOUNTER — Ambulatory Visit (INDEPENDENT_AMBULATORY_CARE_PROVIDER_SITE_OTHER): Payer: 59 | Admitting: Family Medicine

## 2011-10-13 VITALS — BP 116/68 | HR 84 | Temp 98.1°F | Ht 62.5 in | Wt 136.8 lb

## 2011-10-13 DIAGNOSIS — M810 Age-related osteoporosis without current pathological fracture: Secondary | ICD-10-CM

## 2011-10-13 DIAGNOSIS — Z1159 Encounter for screening for other viral diseases: Secondary | ICD-10-CM | POA: Insufficient documentation

## 2011-10-13 DIAGNOSIS — E559 Vitamin D deficiency, unspecified: Secondary | ICD-10-CM

## 2011-10-13 DIAGNOSIS — D696 Thrombocytopenia, unspecified: Secondary | ICD-10-CM

## 2011-10-13 DIAGNOSIS — E785 Hyperlipidemia, unspecified: Secondary | ICD-10-CM

## 2011-10-13 DIAGNOSIS — Z23 Encounter for immunization: Secondary | ICD-10-CM

## 2011-10-13 DIAGNOSIS — Z01419 Encounter for gynecological examination (general) (routine) without abnormal findings: Secondary | ICD-10-CM | POA: Insufficient documentation

## 2011-10-13 NOTE — Patient Instructions (Signed)
Don't forget to schedule your own mammogram for April Try to get 1200-1500 mg of calcium per day with at least 1000 iu of vitamin D - for bone health  In addition to that continue 2000 iu vit D daily  Try to exercise 5 days per week If you are interested in a shingles/zoster vaccine - call your insurance to check on coverage,( you should not get it within 1 month of other vaccines) , then call us for a prescription  for it to take to a pharmacy that gives the shot or get it here Pneumonia vaccine today

## 2011-10-13 NOTE — Progress Notes (Signed)
Subjective:    Patient ID: Angelica Rivers, female    DOB: [birth year], 66 y.o.   MRN: [id number]  HPI Here for check up of chronic medical conditions and to review health mt list   Nothing new - feels good   bp good 116/84 Wt is stable with bmi of 24  Pap 1/10-- was normal  No hx of abn paps  Will do today  Has a vaginal cyst -- does not change   mammo 4/12- she will sched own mammo Self exam- no lumps or changes   OP- last dexa 4/12 mild osteopenia  Was on fosamax over 5 y  Vit D 34 up from 30 Takes 2000 iu daily , not taking any calcium  Exercise- is trying - so adding some into her schedule  Husband had MI 6 mo ago- they are working on healthy habits together    Lab Results  Component Value Date   CHOL 202* 10/06/2011   CHOL 205* 09/22/2010   CHOL 172 09/12/2009   Lab Results  Component Value Date   HDL 68.60 10/06/2011   HDL 60.80 09/22/2010   HDL 61 09/12/2009   Lab Results  Component Value Date   LDLCALC 99 09/12/2009   LDLCALC 112* 01/08/2009   LDLCALC 121* 09/08/2006   Lab Results  Component Value Date   TRIG 79.0 10/06/2011   TRIG 88.0 09/22/2010   TRIG 60 09/12/2009   Lab Results  Component Value Date   CHOLHDL 3 10/06/2011   CHOLHDL 3 09/22/2010   CHOLHDL 2.8 Ratio 09/12/2009   Lab Results  Component Value Date   LDLDIRECT 117.7 10/06/2011   LDLDIRECT 127.8 09/22/2010   LDLDIRECT 120.8 09/12/2008   diet- is also eating better - avoids chol with her husband   Hx of thrombocytopenia  Platelet 137 stable Lab Results  Component Value Date   WBC 5.2 10/06/2011   HGB 13.4 10/06/2011   HCT 39.6 10/06/2011   MCV 89.4 10/06/2011   PLT 137.0* 10/06/2011     Zoster status-- has not had shot   Pneumovax-- never had it   Flu shot --missed it / usually gets them   colonosc 2/10-- ? Due in 5 years No problems or bowel changes  Patient Active Problem List  Diagnoses  . UNSPECIFIED VITAMIN D DEFICIENCY  . HYPERLIPIDEMIA  . THROMBOCYTOPENIA  . FIBROCYSTIC BREAST  DISEASE  . OSTEOPOROSIS  . NEPHROLITHIASIS, HX OF  . Gynecological examination   Past Medical History  Diagnosis Date  . Osteopenia   . Anemia   . Nephrolithiasis     Hx of  . Osteoporosis     fosamax for over 5 yrs.  . Vaginal cyst     stable  . History of repair of mitral valve     proph for dentist   Past Surgical History  Procedure Date  . Cardiac surgery 1995    mitral valve repair carpenters ring  . Lithotripsy for kidney stones   . Tonsillectomy   . Ruptured ovarian cyst surgery   . Hemorrhoid surgery   . Mitral valve replacement 07/2003  . Skin graft dog bite 2010   History  Substance Use Topics  . Smoking status: Never Smoker   . Smokeless tobacco: Not on file  . Alcohol Use:    Family History  Problem Relation Age of Onset  . Colon polyps Mother   . Diabetes Brother    No Known Allergies Current Outpatient Prescriptions on File Prior to Visit  Medication Sig Dispense Refill  . amoxicillin (AMOXIL) 500 MG capsule Take 4 capsules by mouth 30 minutes before dentist appointment  4 capsule  0      Review of Systems Review of Systems  Constitutional: Negative for fever, appetite change, fatigue and unexpected weight change.  Eyes: Negative for pain and visual disturbance.  Respiratory: Negative for cough and shortness of breath.   Cardiovascular: Negative for cp or palpitations    Gastrointestinal: Negative for nausea, diarrhea and constipation.  Genitourinary: Negative for urgency and frequency.  Skin: Negative for pallor or rash   Neurological: Negative for weakness, light-headedness, numbness and headaches.  Hematological: Negative for adenopathy. Does not bruise/bleed easily.  Psychiatric/Behavioral: Negative for dysphoric mood. The patient is not nervous/anxious.          Objective:   Physical Exam  Constitutional: She appears well-developed and well-nourished. No distress.  HENT:  Head: Normocephalic and atraumatic.  Right Ear: External  ear normal.  Left Ear: External ear normal.  Nose: Nose normal.  Mouth/Throat: Oropharynx is clear and moist.  Eyes: Conjunctivae and EOM are normal. Pupils are equal, round, and reactive to light. No scleral icterus.  Neck: Normal range of motion. Neck supple. No JVD present. Carotid bruit is not present. No thyromegaly present.  Cardiovascular: Normal rate, regular rhythm, normal heart sounds and intact distal pulses.  Exam reveals no gallop.   Pulmonary/Chest: Effort normal and breath sounds normal. No respiratory distress. She has no wheezes.  Abdominal: Soft. Bowel sounds are normal. She exhibits no distension, no abdominal bruit and no mass. There is no tenderness.  Genitourinary: Rectum normal and vagina normal. No breast swelling, tenderness, discharge or bleeding. There is no rash or tenderness on the right labia. There is lesion on the left labia. There is no rash or tenderness on the left labia. Uterus is not enlarged and not tender. Cervix exhibits no motion tenderness, no discharge and no friability. Right adnexum displays no mass, no tenderness and no fullness. Left adnexum displays no mass, no tenderness and no fullness. No erythema, tenderness or bleeding around the vagina. No vaginal discharge found.       Breast exam: No mass, nodules, thickening, tenderness, bulging, retraction, inflamation, nipple discharge or skin changes noted.  No axillary or clavicular LA.  Chaperoned exam.    o External genitalia nl appearing  o Urethral meatus nl ppearing  o Urethra  Nl appearing , normally mobile o Bladder nl  o Vagina 1.5 cm stable cyst on L labia minora that is soft and nt o Cervix  Posterior , nl appearing o Uterus  No M or enlargement o Adnexa/parametria no M or abn o Anus and perineum nl appearing   Musculoskeletal: Normal range of motion. She exhibits no edema and no tenderness.  Lymphadenopathy:    She has no cervical adenopathy.  Neurological: She is alert. She has normal  reflexes. No cranial nerve deficit. She exhibits normal muscle tone. Coordination normal.  Skin: Skin is warm and dry. No rash noted. No erythema. No pallor.  Psychiatric: She has a normal mood and affect.          Assessment & Plan:

## 2011-10-14 NOTE — Assessment & Plan Note (Signed)
Stable/mild/asymptomatic  Will continue to monitor

## 2011-10-14 NOTE — Assessment & Plan Note (Signed)
utd dexa Rev that Disc imp of ca andD and exercise Disc fx risk and safety considerations

## 2011-10-14 NOTE — Assessment & Plan Note (Signed)
Rev results- enc further supplementation Disc imp to gen health and bone health

## 2011-10-14 NOTE — Assessment & Plan Note (Signed)
Slightly improved Fairly controlled with diet Disc goals for lipids and reasons to control them Rev labs with pt Rev low sat fat diet in detail

## 2011-10-14 NOTE — Assessment & Plan Note (Signed)
3 year exam and pap Stable vaginal cyst that is bothersome- so will continue to monitor

## 2011-10-26 ENCOUNTER — Encounter: Payer: Self-pay | Admitting: *Deleted

## 2011-11-06 ENCOUNTER — Encounter (HOSPITAL_COMMUNITY): Payer: Self-pay | Admitting: *Deleted

## 2011-11-06 ENCOUNTER — Emergency Department (HOSPITAL_COMMUNITY)
Admission: EM | Admit: 2011-11-06 | Discharge: 2011-11-06 | Disposition: A | Discharge status: Home Health | Payer: 59 | Attending: Emergency Medicine | Admitting: Emergency Medicine

## 2011-11-06 DIAGNOSIS — M949 Disorder of cartilage, unspecified: Secondary | ICD-10-CM | POA: Insufficient documentation

## 2011-11-06 DIAGNOSIS — R300 Dysuria: Secondary | ICD-10-CM | POA: Insufficient documentation

## 2011-11-06 DIAGNOSIS — M899 Disorder of bone, unspecified: Secondary | ICD-10-CM | POA: Insufficient documentation

## 2011-11-06 DIAGNOSIS — M810 Age-related osteoporosis without current pathological fracture: Secondary | ICD-10-CM | POA: Insufficient documentation

## 2011-11-06 DIAGNOSIS — D649 Anemia, unspecified: Secondary | ICD-10-CM | POA: Insufficient documentation

## 2011-11-06 DIAGNOSIS — N390 Urinary tract infection, site not specified: Secondary | ICD-10-CM

## 2011-11-06 LAB — URINALYSIS, ROUTINE W REFLEX MICROSCOPIC
Glucose, UA: NEGATIVE mg/dL
Ketones, ur: NEGATIVE mg/dL
Nitrite: NEGATIVE
Protein, ur: 30 mg/dL — AB

## 2011-11-06 LAB — URINE MICROSCOPIC-ADD ON

## 2011-11-06 MED ORDER — PHENAZOPYRIDINE HCL 200 MG PO TABS: 200.0000 mg | ORAL_TABLET | Freq: Three times a day (TID) | ORAL | Status: AC

## 2011-11-06 MED ORDER — CEPHALEXIN 500 MG PO CAPS: 500.0000 mg | ORAL_CAPSULE | Freq: Four times a day (QID) | ORAL | Status: AC

## 2011-11-06 MED ORDER — CEPHALEXIN 500 MG PO CAPS
500.0000 mg | ORAL_CAPSULE | Freq: Once | ORAL | Status: AC
  Administered 2011-11-06: 500 mg via ORAL
  Filled 2011-11-06: qty 1

## 2011-11-06 MED ORDER — PHENAZOPYRIDINE HCL 200 MG PO TABS
200.0000 mg | ORAL_TABLET | Freq: Once | ORAL | Status: AC
  Administered 2011-11-06: 200 mg via ORAL
  Filled 2011-11-06: qty 1

## 2011-11-06 NOTE — ED Provider Notes (Signed)
History     CSN: [id number]  Arrival date & time 11/06/11  1017   First MD Initiated Contact with Patient 11/06/11 1045      Chief Complaint  Patient presents with  . Dysuria    (Consider location/radiation/quality/duration/timing/severity/associated sxs/prior treatment) Patient is a 66 y.o. female presenting with dysuria. The history is provided by the patient.  Dysuria  Pertinent negatives include no chills and no vomiting.  pt c/o urinary urgency and dysuria for past 1-2 days. Episodic. Dull/burning. Denies hx uti or pyelo. States had right flank pain a couple days ago although none currently. No hematuria. No fever or chills. No nv. Dysuria worse when urinating, no other exacerbating or alleviating factors.   Past Medical History  Diagnosis Date  . Osteopenia   . Anemia   . Nephrolithiasis     Hx of  . Osteoporosis     fosamax for over 5 yrs.  . Vaginal cyst     stable  . History of repair of mitral valve     proph for dentist    Past Surgical History  Procedure Date  . Cardiac surgery 1995    mitral valve repair carpenters ring  . Lithotripsy for kidney stones   . Tonsillectomy   . Ruptured ovarian cyst surgery   . Hemorrhoid surgery   . Mitral valve replacement 07/2003  . Skin graft dog bite 2010    Family History  Problem Relation Age of Onset  . Colon polyps Mother   . Diabetes Brother     History  Substance Use Topics  . Smoking status: Never Smoker   . Smokeless tobacco: Never Used  . Alcohol Use: No    OB History    Grav Para Term Preterm Abortions TAB SAB Ect Mult Living                  Review of Systems  Constitutional: Negative for fever and chills.  Gastrointestinal: Negative for vomiting, abdominal pain and diarrhea.  Genitourinary: Positive for dysuria.  Musculoskeletal: Negative for back pain.    Allergies  Review of patient's allergies indicates no known allergies.  Home Medications   Current Outpatient Rx  Name Route  Sig Dispense Refill  . AMOXICILLIN 500 MG PO CAPS  Take 4 capsules by mouth 30 minutes before dentist appointment 4 capsule 0  . ASPIRIN 325 MG PO TABS Oral Take 325 mg by mouth every other day.    Marland Kitchen VITAMIN D 2000 UNITS PO CAPS Oral Take 2 capsules by mouth daily.    Marland Kitchen FLAX SEED OIL 1000 MG PO CAPS Oral Take 1 capsule by mouth continuous dialysis.      BP 125/90  Pulse 116  Temp(Src) 98.7 F (37.1 C) (Oral)  Resp 16  Ht 5\' 5"  (1.651 m)  Wt 133 lb 9.6 oz (60.601 kg)  BMI 22.23 kg/m2  SpO2 97%  Physical Exam  Nursing note and vitals reviewed. Constitutional: She appears well-developed and well-nourished. No distress.  Eyes: Conjunctivae are normal. No scleral icterus.  Neck: Neck supple. No tracheal deviation present.  Cardiovascular: Normal rate.   Pulmonary/Chest: Effort normal. No respiratory distress.  Abdominal: Soft. Normal appearance and bowel sounds are normal. She exhibits no distension. There is no tenderness. There is no rebound and no guarding.  Genitourinary:       No cva tenderness  Musculoskeletal: She exhibits no edema.  Neurological: She is alert.  Skin: Skin is warm and dry. No rash noted.  Psychiatric: She  has a normal mood and affect.    ED Course  Procedures (including critical care time)   Labs Reviewed  URINALYSIS, ROUTINE W REFLEX MICROSCOPIC   Results for orders placed during the hospital encounter of 11/06/11  URINALYSIS, ROUTINE W REFLEX MICROSCOPIC      Component Value Range   Color, Urine YELLOW  YELLOW    APPearance CLOUDY (*) CLEAR    Specific Gravity, Urine 1.031 (*) 1.005 - 1.030    pH 5.5  5.0 - 8.0    Glucose, UA NEGATIVE  NEGATIVE (mg/dL)   Hgb urine dipstick LARGE (*) NEGATIVE    Bilirubin Urine SMALL (*) NEGATIVE    Ketones, ur NEGATIVE  NEGATIVE (mg/dL)   Protein, ur 30 (*) NEGATIVE (mg/dL)   Urobilinogen, UA 0.2  0.0 - 1.0 (mg/dL)   Nitrite NEGATIVE  NEGATIVE    Leukocytes, UA MODERATE (*) NEGATIVE   URINE MICROSCOPIC-ADD  ON      Component Value Range   Squamous Epithelial / LPF MANY (*) RARE    WBC, UA 7-10  <3 (WBC/hpf)   RBC / HPF 11-20  <3 (RBC/hpf)   Bacteria, UA FEW (*) RARE    Urine-Other MUCOUS PRESENT          MDM  Labs sent.   Pyridium po. Keflex po.  Recheck pt comfortable. Denies flank or abd pain. No chills/sweats.       Suzi Roots, MD 11/06/11 1130

## 2011-11-06 NOTE — Discharge Instructions (Signed)
Drink plenty of fluids. Take keflex (antibiotic) as prescribed. Take pyridium as need for bladder pain/spasm. Take tylenol/motrin as need.  Follow up with primary care doctor in the next couple days if symptoms fail to improve/resolve.   Return to ER if worse, severe back/flank pain, abdominal pain, vomiting, fevers, weak/faint, other concern.    Urinary Tract Infection Infections of the urinary tract can start in several places. A bladder infection (cystitis), a kidney infection (pyelonephritis), and a prostate infection (prostatitis) are different types of urinary tract infections (UTIs). They usually get better if treated with medicines (antibiotics) that kill germs. Take all the medicine until it is gone. You or your child may feel better in a few days, but TAKE ALL MEDICINE or the infection may not respond and may become more difficult to treat. HOME CARE INSTRUCTIONS   Drink enough water and fluids to keep the urine clear or pale yellow. Cranberry juice is especially recommended, in addition to large amounts of water.   Avoid caffeine, tea, and carbonated beverages. They tend to irritate the bladder.   Alcohol may irritate the prostate.   Only take over-the-counter or prescription medicines for pain, discomfort, or fever as directed by your caregiver.  To prevent further infections:  Empty the bladder often. Avoid holding urine for long periods of time.   After a bowel movement, women should cleanse from front to back. Use each tissue only once.   Empty the bladder before and after sexual intercourse.  FINDING OUT THE RESULTS OF YOUR TEST Not all test results are available during your visit. If your or your child's test results are not back during the visit, make an appointment with your caregiver to find out the results. Do not assume everything is normal if you have not heard from your caregiver or the medical facility. It is important for you to follow up on all test  results. SEEK MEDICAL CARE IF:   There is back pain.   Your baby is older than 3 months with a rectal temperature of 100.5 F (38.1 C) or higher for more than 1 day.   Your or your child's problems (symptoms) are no better in 3 days. Return sooner if you or your child is getting worse.  SEEK IMMEDIATE MEDICAL CARE IF:   There is severe back pain or lower abdominal pain.   You or your child develops chills.   You have a fever.   Your baby is older than 3 months with a rectal temperature of 102 F (38.9 C) or higher.   Your baby is 29 months old or younger with a rectal temperature of 100.4 F (38 C) or higher.   There is nausea or vomiting.   There is continued burning or discomfort with urination.  MAKE SURE YOU:   Understand these instructions.   Will watch your condition.   Will get help right away if you are not doing well or get worse.  Document Released: 05/26/2005 Document Revised: 08/05/2011 Document Reviewed: 12/29/2006 [facility] Patient Information 2012 [place], [place].

## 2011-11-06 NOTE — ED Notes (Signed)
Pt states she has vaginal burning, beginning today, pt states she has has increased in urinary urgency and left flank pain earlier this week but pain eased off. Pt states she has a history of kidney stones.

## 2011-11-06 NOTE — ED Notes (Signed)
Pt from home with reports of urethral pain/burning and urinary frequency since last Friday, attempted to make appt with Urologist but unable to be seen until Monday.

## 2012-02-07 ENCOUNTER — Encounter: Payer: Self-pay | Admitting: Family Medicine

## 2012-02-15 ENCOUNTER — Encounter: Payer: Self-pay | Admitting: Family Medicine

## 2012-03-10 ENCOUNTER — Telehealth: Payer: Self-pay | Admitting: *Deleted

## 2012-03-10 MED ORDER — AMOXICILLIN 500 MG PO CAPS: ORAL_CAPSULE | ORAL | Status: DC

## 2012-03-10 NOTE — Telephone Encounter (Signed)
Will refill electronically  

## 2012-03-10 NOTE — Telephone Encounter (Signed)
Informed patient that Rx has been called in.

## 2012-03-10 NOTE — Telephone Encounter (Signed)
Faxed refill request for amox 500 mg's, # 4, to take prior to dentist appt.  This is no longer on med list.  Last filled 09/06/11.  [facility] [address].

## 2012-12-16 ENCOUNTER — Telehealth: Payer: Self-pay | Admitting: Family Medicine

## 2012-12-16 DIAGNOSIS — E559 Vitamin D deficiency, unspecified: Secondary | ICD-10-CM

## 2012-12-16 DIAGNOSIS — Z0000 Encounter for general adult medical examination without abnormal findings: Secondary | ICD-10-CM | POA: Insufficient documentation

## 2012-12-16 DIAGNOSIS — D696 Thrombocytopenia, unspecified: Secondary | ICD-10-CM

## 2012-12-16 DIAGNOSIS — E785 Hyperlipidemia, unspecified: Secondary | ICD-10-CM

## 2012-12-16 NOTE — Telephone Encounter (Signed)
Message copied by Roxy Manns A on Sat Dec 16, 2012 12:04 PM ------      Message from: Baldomero Lamy      Created: Mon Dec 11, 2012 10:49 AM      Regarding: Cpx labs Mon 4/21       Please order  future cpx labs for pt's upcoming lab appt.      Thanks      Tasha             ------

## 2012-12-18 ENCOUNTER — Other Ambulatory Visit (INDEPENDENT_AMBULATORY_CARE_PROVIDER_SITE_OTHER): Payer: 59

## 2012-12-18 DIAGNOSIS — E559 Vitamin D deficiency, unspecified: Secondary | ICD-10-CM

## 2012-12-18 DIAGNOSIS — D696 Thrombocytopenia, unspecified: Secondary | ICD-10-CM

## 2012-12-18 DIAGNOSIS — Z0000 Encounter for general adult medical examination without abnormal findings: Secondary | ICD-10-CM

## 2012-12-18 DIAGNOSIS — E785 Hyperlipidemia, unspecified: Secondary | ICD-10-CM

## 2012-12-18 LAB — COMPREHENSIVE METABOLIC PANEL
ALT: 19 U/L (ref 0–35)
AST: 22 U/L (ref 0–37)
Albumin: 4.2 g/dL (ref 3.5–5.2)
Calcium: 9 mg/dL (ref 8.4–10.5)
Chloride: 105 mEq/L (ref 96–112)
Creatinine, Ser: 0.9 mg/dL (ref 0.4–1.2)
Potassium: 3.9 mEq/L (ref 3.5–5.1)

## 2012-12-18 LAB — CBC WITH DIFFERENTIAL/PLATELET
Basophils Absolute: 0 10*3/uL (ref 0.0–0.1)
Eosinophils Absolute: 0.1 10*3/uL (ref 0.0–0.7)
Lymphocytes Relative: 38.9 % (ref 12.0–46.0)
MCHC: 32.9 g/dL (ref 30.0–36.0)
Neutrophils Relative %: 50 % (ref 43.0–77.0)
RDW: 13.3 % (ref 11.5–14.6)

## 2012-12-18 LAB — LIPID PANEL
HDL: 58.8 mg/dL (ref >39.00)
Total CHOL/HDL Ratio: 3

## 2012-12-19 LAB — VITAMIN D 25 HYDROXY (VIT D DEFICIENCY, FRACTURES): Vit D, 25-Hydroxy: 35 ng/mL (ref 30–89)

## 2012-12-25 ENCOUNTER — Encounter: Payer: Self-pay | Admitting: Family Medicine

## 2012-12-25 ENCOUNTER — Ambulatory Visit (INDEPENDENT_AMBULATORY_CARE_PROVIDER_SITE_OTHER): Payer: 59 | Admitting: Family Medicine

## 2012-12-25 VITALS — BP 108/70 | HR 83 | Temp 97.6°F | Ht 62.25 in | Wt 133.5 lb

## 2012-12-25 DIAGNOSIS — Z23 Encounter for immunization: Secondary | ICD-10-CM

## 2012-12-25 DIAGNOSIS — E785 Hyperlipidemia, unspecified: Secondary | ICD-10-CM

## 2012-12-25 DIAGNOSIS — M810 Age-related osteoporosis without current pathological fracture: Secondary | ICD-10-CM

## 2012-12-25 DIAGNOSIS — E559 Vitamin D deficiency, unspecified: Secondary | ICD-10-CM

## 2012-12-25 DIAGNOSIS — Z0000 Encounter for general adult medical examination without abnormal findings: Secondary | ICD-10-CM

## 2012-12-25 DIAGNOSIS — D696 Thrombocytopenia, unspecified: Secondary | ICD-10-CM

## 2012-12-25 NOTE — Assessment & Plan Note (Signed)
Reviewed health habits including diet and exercise and skin cancer prevention Also reviewed health mt list, fam hx and immunizations  Tdap today in light of new baby in family  Will call in 1 mo to get zostavax She will make own mammo appt for June  We will schedule dexa  Wellness labs reviewed

## 2012-12-25 NOTE — Patient Instructions (Addendum)
Tdap vaccine today Make a nurse appointment here in about a month for your shingles vaccine Don't forget to make your own mammogram appt  See Shirlee Limerick at check out to schedule bone density test  Take care of yourself and stay active Continue calcium and vitamin D for your bones

## 2012-12-25 NOTE — Assessment & Plan Note (Signed)
No symptoms  Platelets 138 Will continue to watch

## 2012-12-25 NOTE — Assessment & Plan Note (Signed)
Pt has had 5 y of fosamax in the past  On ca and D  D level is tx now on 2000 iu daily Enc exercise sched 2 y f/u dexa

## 2012-12-25 NOTE — Assessment & Plan Note (Signed)
D level is now fine on 2000 iu daily  Disc imp to bone and overall health

## 2012-12-25 NOTE — Progress Notes (Signed)
Subjective:    Patient ID: Angelica Rivers, female    DOB: 03-06-1946, 67 y.o.   MRN: [id number]  HPI Here for health maintenance exam and to review chronic medical problems    Wt is stable with bmi of 24 Has feeling good   Zoster status-has not had a shingles shot -ins does cover it - she will schedule that    Pap 2/13 normal No gyn problems at all  No hx of abn paps  Has ovarian cyst that is being watched   mammo 6/13 nl Self exam -no lumps at all   Flu vaccine- got that this fall   colonosc 2/10 nl - 5 year f/u for fam hx   Td 7/10- but needs a tdap due to daughter having a baby - is very excited  ptx 2/13  Osteopenia dexa 4/12- due now, will refer for that  D level is 35 which is stable- on 2000 iu D per day  She exercises  5 y of fosamax in the past    Hx of thrombocytopenia -asymptomatic  No easy bleeding  No new bruising  Lab Results  Component Value Date   WBC 4.2* 12/18/2012   HGB 12.7 12/18/2012   HCT 38.6 12/18/2012   MCV 86.9 12/18/2012   PLT 138.0* 12/18/2012    Hyperlipidemia Lab Results  Component Value Date   CHOL 193 12/18/2012   CHOL 202* 10/06/2011   CHOL 205* 09/22/2010   Lab Results  Component Value Date   HDL 58.80 12/18/2012   HDL 78.29 10/06/2011   HDL 60.80 09/22/2010   Lab Results  Component Value Date   LDLCALC 123* 12/18/2012   LDLCALC 99 09/12/2009   LDLCALC 112* 01/08/2009   Lab Results  Component Value Date   TRIG 58.0 12/18/2012   TRIG 79.0 10/06/2011   TRIG 88.0 09/22/2010   Lab Results  Component Value Date   CHOLHDL 3 12/18/2012   CHOLHDL 3 10/06/2011   CHOLHDL 3 09/22/2010   Lab Results  Component Value Date   LDLDIRECT 117.7 10/06/2011   LDLDIRECT 127.8 09/22/2010   LDLDIRECT 120.8 09/12/2008   overall fairly stable  HDL down a bit - not as much exercise -will be doing well with that soon  Mood- is good / no depression/ good motivation 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 UNSPECIFIED VITAMIN D DEFICIENCY  . HYPERLIPIDEMIA  . THROMBOCYTOPENIA  . FIBROCYSTIC BREAST DISEASE  . Osteopenia  . NEPHROLITHIASIS, HX OF  . Gynecological examination  . Routine general medical examination at a health care facility   Past Medical History  Diagnosis Date  . Osteopenia   . Anemia   . Nephrolithiasis     Hx of  . Osteoporosis     fosamax for over 5 yrs.  . Vaginal cyst     stable  . History of repair of mitral valve     proph for dentist   Past Surgical History  Procedure Laterality Date  . Cardiac surgery  1995    mitral valve repair carpenters ring  . Lithotripsy for kidney stones    . Tonsillectomy    . Ruptured ovarian cyst surgery    . Hemorrhoid surgery    . Mitral  valve replacement  07/2003  . Skin graft dog bite  2010   History  Substance Use Topics  . Smoking status: Never Smoker   . Smokeless tobacco: Never Used  . Alcohol Use: No   Family History  Problem Relation Age of Onset  . Colon polyps Mother   . Diabetes Brother    No Known Allergies Current Outpatient Prescriptions on File Prior to Visit  Medication Sig Dispense Refill  . amoxicillin (AMOXIL) 500 MG capsule Take 4 pills by mouth before dental appt  4 capsule  3  . aspirin 325 MG tablet Take 325 mg by mouth every other day.      . Cholecalciferol (VITAMIN D) 2000 UNITS CAPS Take 2 capsules by mouth daily.      . Flaxseed, Linseed, (FLAX SEED OIL) 1000 MG CAPS Take 1 capsule by mouth daily.        No current facility-administered medications on file prior to visit.    Review of Systems Review of Systems  Constitutional: Negative for fever, appetite change, fatigue and unexpected weight change.  Eyes: Negative for pain and visual disturbance.  Respiratory: Negative for cough and shortness of breath.    Cardiovascular: Negative for cp or palpitations    Gastrointestinal: Negative for nausea, diarrhea and constipation.  Genitourinary: Negative for urgency and frequency.  Skin: Negative for pallor or rash   Neurological: Negative for weakness, light-headedness, numbness and headaches.  Hematological: Negative for adenopathy. Does not bruise/bleed easily.  Psychiatric/Behavioral: Negative for dysphoric mood. The patient is not nervous/anxious.         Objective:   Physical Exam  Constitutional: She appears well-developed and well-nourished. No distress.  HENT:  Head: Normocephalic and atraumatic.  Right Ear: External ear normal.  Left Ear: External ear normal.  Nose: Nose normal.  Mouth/Throat: Oropharynx is clear and moist.  Eyes: Conjunctivae and EOM are normal. Pupils are equal, round, and reactive to light. Right eye exhibits no discharge. Left eye exhibits no discharge. No scleral icterus.  Neck: Normal range of motion. Neck supple. No JVD present. Carotid bruit is not present. No thyromegaly present.  Cardiovascular: Normal rate, regular rhythm, normal heart sounds and intact distal pulses.  Exam reveals no gallop.   Pulmonary/Chest: Effort normal and breath sounds normal. No respiratory distress. She has no wheezes. She has no rales.  Abdominal: Soft. Bowel sounds are normal. She exhibits no distension, no abdominal bruit and no mass. There is no tenderness.  Genitourinary: No breast swelling, tenderness, discharge or bleeding.  Breast exam: No mass, nodules, thickening, tenderness, bulging, retraction, inflamation, nipple discharge or skin changes noted.  No axillary or clavicular LA.  Chaperoned exam.    Musculoskeletal: She exhibits no edema and no tenderness.  No kyphosis  Lymphadenopathy:    She has no cervical adenopathy.  Neurological: She is alert. She has normal reflexes. No cranial nerve deficit. She exhibits normal muscle tone. Coordination normal.  Skin: Skin is warm  and dry. No rash noted. No erythema. No pallor.  Psychiatric: She has a normal mood and affect.          Assessment & Plan:

## 2012-12-25 NOTE — Assessment & Plan Note (Signed)
Lipids are fairly stable -HDL down a bit but still good at 58 Disc goals for lipids and reasons to control them Rev labs with pt Rev low sat fat diet in detail

## 2013-01-24 ENCOUNTER — Ambulatory Visit (INDEPENDENT_AMBULATORY_CARE_PROVIDER_SITE_OTHER): Payer: 59 | Admitting: *Deleted

## 2013-01-24 DIAGNOSIS — Z2911 Encounter for prophylactic immunotherapy for respiratory syncytial virus (RSV): Secondary | ICD-10-CM

## 2013-01-24 DIAGNOSIS — Z23 Encounter for immunization: Secondary | ICD-10-CM

## 2013-02-19 ENCOUNTER — Encounter: Payer: Self-pay | Admitting: Family Medicine

## 2013-02-21 ENCOUNTER — Encounter: Payer: Self-pay | Admitting: *Deleted

## 2013-02-22 ENCOUNTER — Encounter: Payer: Self-pay | Admitting: Family Medicine

## 2013-02-23 ENCOUNTER — Encounter: Payer: Self-pay | Admitting: Family Medicine

## 2013-02-23 ENCOUNTER — Encounter: Payer: Self-pay | Admitting: *Deleted

## 2013-06-22 ENCOUNTER — Other Ambulatory Visit: Payer: Self-pay | Admitting: Family Medicine

## 2013-06-22 MED ORDER — AMOXICILLIN 500 MG PO CAPS: ORAL_CAPSULE | ORAL | Status: DC

## 2013-06-22 NOTE — Telephone Encounter (Signed)
Please refill times one -- sounds like [facility] needs to get their act together or find out why the faxes are not going through

## 2013-06-22 NOTE — Telephone Encounter (Signed)
[facility] left vm regarding pt's request to refill Amoxicillin 500mg  , 4 caps prior to dental work.  [facility] says [facility] have sent multiple requests through fax as well as electronically but we have not received them.  Pt is having dental work on Monday.

## 2013-06-22 NOTE — Telephone Encounter (Signed)
done

## 2013-08-15 ENCOUNTER — Encounter: Payer: Self-pay | Admitting: Gastroenterology

## 2013-09-06 ENCOUNTER — Encounter: Payer: Self-pay | Admitting: Family Medicine

## 2013-09-06 ENCOUNTER — Ambulatory Visit (INDEPENDENT_AMBULATORY_CARE_PROVIDER_SITE_OTHER): Payer: Medicare Other | Admitting: Family Medicine

## 2013-09-06 VITALS — BP 110/70 | HR 102 | Temp 98.2°F | Ht 62.25 in | Wt 133.0 lb

## 2013-09-06 DIAGNOSIS — J111 Influenza due to unidentified influenza virus with other respiratory manifestations: Secondary | ICD-10-CM

## 2013-09-06 NOTE — Progress Notes (Signed)
   Patient Name: Angelica Rivers Date of Birth: 1946/07/08 Medical Record Number: [id number] Gender: female  History of Present Illness:  Angelica Rivers presents with runny nose, sneezing, cough, sore throat, malaise, myalgias, arthralgia, chills, and fever.  + recent exposure to others with similar symptoms. (husband with confirmed flu)  The patent denies sore throat as the primary complaint. Denies sthortness of breath/wheezing, otalgia, facial pain, abdominal pain, changes in bowel or bladder.  Generally feels terrible  Tmax: ?  PMH, PHS, Allergies, Problem List, Medications, Family History, and Social History have all been reviewed.  Review of Systems: as above, eating and drinking - tolerating PO. Urinating normally. No excessive vomitting or diarrhea. O/w as above.  Physical Exam:  Filed Vitals:   09/06/13 1049  BP: 110/70  Pulse: 102  Temp: 98.2 F (36.8 C)  TempSrc: Oral  Height: 5' 2.25" (1.581 m)  Weight: 133 lb (60.328 kg)    Gen: WDWN, NAD; A & O x3, cooperative. Pleasant.Globally Non-toxic HEENT: Normocephalic and atraumatic. Throat clear, w/o exudate, R TM clear, L TM - good landmarks, No fluid present. rhinnorhea. No frontal or maxillary sinus T. MMM NECK: Anterior cervical  LAD is absent CV: RRR, No M/G/R, cap refill <2 sec PULM: Breathing comfortably in no respiratory distress. no wheezing, crackles, rhonchi ABD: S,NT,ND,+BS. No HSM. No rebound. EXT: No c/c/e PSYCH: Friendly, good eye contact MSK: Nml gait  Assessment and Plan: 1. Influenza: The patient's clinical exam and history is consistent with a diagnosis of influenza. Tamiflu proph given for her 68 yo mother  Supportive care. Fluids. Cough medicines as needed  Anti-pyretics.  Infection control emphasized, including OOW or school until AF 24 hours.  No orders of the defined types were placed in this encounter.    Patient Instructions: INFLUENZA Viral illness: High fever, headache,  bad cough, body aches, sore throat, sometimes nausea, vomitting, diarrhea.  Usually quick onset  TREATMENT 1. Decongestant: Sudafed (NOT IF HIGH BLOOD PRESSURE) 2. Nose Sprays: Saline nasal spray, Can use Afrin or Neosynephrine, but no more than 3 days 3. Cough Suppressants: DM portion of cough med, or Strong prescription 4. Expectorant: Liquify Secretions (Guaifenesin) 5. Example: Mucinex (expectorant) or Mucinex-D (expectorant and decongestant) 6. Take all prescribed meds 7. Anti-virals may be used if caught early or in high risk people. (Do NOT if pregnant, breast feeding, seizure disorder) 8. Rest helpful, but move some during day 9. Breathe moist air: Humidifier, Vaporizer, or steam from shower 10. No work or school until no fever for 24 hrs WITH NO Tylenol or Ibuprofen. 11. Pneumonia on top of Flu is possible, if you are doing poorly particularly if a smoker or have COPD, please let us know. 12. Wash hands and cover mouth with cough   Signed,  Lorieann Argueta T. Maymie Brunke, MD, [place] [facility] [place] [address] Phone: [phone] Fax: [phone]

## 2013-09-06 NOTE — Progress Notes (Signed)
Pre-visit discussion using our clinic review tool. No additional management support is needed unless otherwise documented below in the visit note.  

## 2013-12-08 ENCOUNTER — Telehealth: Payer: Self-pay | Admitting: Family Medicine

## 2013-12-08 DIAGNOSIS — Z0000 Encounter for general adult medical examination without abnormal findings: Secondary | ICD-10-CM | POA: Insufficient documentation

## 2013-12-08 DIAGNOSIS — M8580 Other specified disorders of bone density and structure, unspecified site: Secondary | ICD-10-CM

## 2013-12-08 DIAGNOSIS — E559 Vitamin D deficiency, unspecified: Secondary | ICD-10-CM

## 2013-12-08 DIAGNOSIS — E785 Hyperlipidemia, unspecified: Secondary | ICD-10-CM

## 2013-12-08 DIAGNOSIS — D696 Thrombocytopenia, unspecified: Secondary | ICD-10-CM

## 2013-12-08 NOTE — Telephone Encounter (Signed)
Message copied by Abner Greenspan on Sat Dec 08, 2013  9:13 AM ------      Message from: Ellamae Sia      Created: Thu Nov 29, 2013 11:25 AM      Regarding: Lab orders for Monday, 4.13.15       Patient is scheduled for CPX labs, please order future labs, Thanks , Terri       ------

## 2013-12-10 ENCOUNTER — Other Ambulatory Visit (INDEPENDENT_AMBULATORY_CARE_PROVIDER_SITE_OTHER): Payer: Medicare Other

## 2013-12-10 DIAGNOSIS — E785 Hyperlipidemia, unspecified: Secondary | ICD-10-CM

## 2013-12-10 DIAGNOSIS — M8580 Other specified disorders of bone density and structure, unspecified site: Secondary | ICD-10-CM

## 2013-12-10 DIAGNOSIS — E559 Vitamin D deficiency, unspecified: Secondary | ICD-10-CM

## 2013-12-10 DIAGNOSIS — D696 Thrombocytopenia, unspecified: Secondary | ICD-10-CM

## 2013-12-10 LAB — COMPREHENSIVE METABOLIC PANEL
ALT: 20 U/L (ref 0–35)
AST: 24 U/L (ref 0–37)
Albumin: 4.1 g/dL (ref 3.5–5.2)
Alkaline Phosphatase: 41 U/L (ref 39–117)
BILIRUBIN TOTAL: 0.6 mg/dL (ref 0.3–1.2)
BUN: 17 mg/dL (ref 6–23)
CALCIUM: 9.2 mg/dL (ref 8.4–10.5)
CHLORIDE: 105 meq/L (ref 96–112)
CO2: 25 meq/L (ref 19–32)
CREATININE: 0.8 mg/dL (ref 0.4–1.2)
GFR: 73.76 mL/min (ref >60.00)
Glucose, Bld: 80 mg/dL (ref 70–99)
Potassium: 4.4 mEq/L (ref 3.5–5.1)
Sodium: 140 mEq/L (ref 135–145)
Total Protein: 7.1 g/dL (ref 6.0–8.3)

## 2013-12-10 LAB — CBC WITH DIFFERENTIAL/PLATELET
BASOS PCT: 0.3 % (ref 0.0–3.0)
Basophils Absolute: 0 10*3/uL (ref 0.0–0.1)
Eosinophils Absolute: 0.1 10*3/uL (ref 0.0–0.7)
Eosinophils Relative: 2 % (ref 0.0–5.0)
HEMATOCRIT: 39.1 % (ref 36.0–46.0)
HEMOGLOBIN: 12.9 g/dL (ref 12.0–15.0)
LYMPHS ABS: 1.8 10*3/uL (ref 0.7–4.0)
LYMPHS PCT: 36 % (ref 12.0–46.0)
MCHC: 32.9 g/dL (ref 30.0–36.0)
MCV: 88.2 fl (ref 78.0–100.0)
MONOS PCT: 8.4 % (ref 3.0–12.0)
Monocytes Absolute: 0.4 10*3/uL (ref 0.1–1.0)
NEUTROS ABS: 2.7 10*3/uL (ref 1.4–7.7)
Neutrophils Relative %: 53.3 % (ref 43.0–77.0)
Platelets: 129 10*3/uL — ABNORMAL LOW (ref 150.0–400.0)
RBC: 4.44 Mil/uL (ref 3.87–5.11)
RDW: 13.6 % (ref 11.5–14.6)
WBC: 5 10*3/uL (ref 4.5–10.5)

## 2013-12-10 LAB — LIPID PANEL
CHOLESTEROL: 186 mg/dL (ref 0–200)
HDL: 64.3 mg/dL (ref >39.00)
LDL Cholesterol: 110 mg/dL — ABNORMAL HIGH (ref 0–99)
TRIGLYCERIDES: 59 mg/dL (ref 0.0–149.0)
Total CHOL/HDL Ratio: 3
VLDL: 11.8 mg/dL (ref 0.0–40.0)

## 2013-12-10 LAB — TSH: TSH: 0.9 u[IU]/mL (ref 0.35–5.50)

## 2013-12-11 LAB — VITAMIN D 25 HYDROXY (VIT D DEFICIENCY, FRACTURES): Vit D, 25-Hydroxy: 50 ng/mL (ref 30–89)

## 2013-12-17 ENCOUNTER — Encounter: Payer: Self-pay | Admitting: Family Medicine

## 2013-12-17 ENCOUNTER — Ambulatory Visit (INDEPENDENT_AMBULATORY_CARE_PROVIDER_SITE_OTHER): Payer: Medicare Other | Admitting: Family Medicine

## 2013-12-17 VITALS — BP 122/78 | HR 82 | Temp 97.9°F | Ht 62.25 in | Wt 137.0 lb

## 2013-12-17 DIAGNOSIS — M899 Disorder of bone, unspecified: Secondary | ICD-10-CM

## 2013-12-17 DIAGNOSIS — Z0000 Encounter for general adult medical examination without abnormal findings: Secondary | ICD-10-CM

## 2013-12-17 DIAGNOSIS — M949 Disorder of cartilage, unspecified: Secondary | ICD-10-CM

## 2013-12-17 DIAGNOSIS — E559 Vitamin D deficiency, unspecified: Secondary | ICD-10-CM

## 2013-12-17 DIAGNOSIS — E785 Hyperlipidemia, unspecified: Secondary | ICD-10-CM

## 2013-12-17 DIAGNOSIS — M8580 Other specified disorders of bone density and structure, unspecified site: Secondary | ICD-10-CM

## 2013-12-17 DIAGNOSIS — Z23 Encounter for immunization: Secondary | ICD-10-CM

## 2013-12-17 DIAGNOSIS — Z1211 Encounter for screening for malignant neoplasm of colon: Secondary | ICD-10-CM

## 2013-12-17 NOTE — Assessment & Plan Note (Signed)
D level is tx  Vitamin D level is therapeutic with current supplementation Disc importance of this to bone and overall health

## 2013-12-17 NOTE — Progress Notes (Signed)
Pre visit review using our clinic review tool, if applicable. No additional management support is needed unless otherwise documented below in the visit note. 

## 2013-12-17 NOTE — Progress Notes (Signed)
Subjective:    Patient ID: Angelica Rivers, female    DOB: 1945/12/18, 68 y.o.   MRN: [id number]  HPI I have personally reviewed the Medicare Annual Wellness questionnaire and have noted 1. The patient's medical and social history 2. Their use of alcohol, tobacco or illicit drugs 3. Their current medications and supplements 4. The patient's functional ability including ADL's, fall risks, home safety risks and hearing or visual             impairment. 5. Diet and physical activities 6. Evidence for depression or mood disorders  The patients weight, height, BMI have been recorded in the chart and visual acuity is per eye clinic.  I have made referrals, counseling and provided education to the patient based review of the above and I have provided the pt with a written personalized care plan for preventive services.  Is doing well in general    See scanned forms.  Routine anticipatory guidance given to patient.  See health maintenance. Colon cancer screening 2/10 and she is due for a 5 year recall (family hx of colon cancer)  Breast cancer screening 6/14 nl mammogram  Self breast exam - no lumps or changes  No gyn problems at all  Flu vaccine she got it in the fall  Tetanus vaccine 4/14 Tdap Pneumovax 2/13  Zoster vaccine 5/14   Advance directive- does not have a living will - is interested in drawing one up Cognitive function addressed- see scanned forms- and if abnormal then additional documentation follows. - no concerns about memory   PMH and SH reviewed  Meds, vitals, and allergies reviewed.   ROS: See HPI.  Otherwise negative.      osteopenia  6/14 dexa - mild  D level is 50  Has had over 5 y of fosamax in the past  No fractures   Hx of slt low platelet Lab Results  Component Value Date   WBC 5.0 12/10/2013   HGB 12.9 12/10/2013   HCT 39.1 12/10/2013   MCV 88.2 12/10/2013   PLT 129.0* 12/10/2013   this is stable No bruising or easy bleeding   Hyperlipidemia    Lab Results  Component Value Date   CHOL 186 12/10/2013   CHOL 193 12/18/2012   CHOL 202* 10/06/2011   Lab Results  Component Value Date   HDL 64.30 12/10/2013   HDL 58.80 12/18/2012   HDL 68.60 10/06/2011   Lab Results  Component Value Date   LDLCALC 110* 12/10/2013   LDLCALC 123* 12/18/2012   LDLCALC 99 09/12/2009   Lab Results  Component Value Date   TRIG 59.0 12/10/2013   TRIG 58.0 12/18/2012   TRIG 79.0 10/06/2011   Lab Results  Component Value Date   CHOLHDL 3 12/10/2013   CHOLHDL 3 12/18/2012   CHOLHDL 3 10/06/2011   Lab Results  Component Value Date   LDLDIRECT 117.7 10/06/2011   LDLDIRECT 127.8 09/22/2010   LDLDIRECT 120.8 09/12/2008    Cholesterol is down - LDL down and HDL is up  She is eating better  Also getting some exercise - walking regularly   Patient Active Problem List   Diagnosis Date Noted  . Encounter for Medicare annual wellness exam 12/08/2013  . Routine general medical examination at a health care facility 12/16/2012  . Gynecological examination 10/13/2011  . UNSPECIFIED VITAMIN D DEFICIENCY 09/23/2009  . THROMBOCYTOPENIA 09/23/2009  . HYPERLIPIDEMIA 09/12/2008  . FIBROCYSTIC BREAST DISEASE 09/08/2007  . Osteopenia 09/08/2007  . NEPHROLITHIASIS, HX  OF 09/08/2007   Past Medical History  Diagnosis Date  . Osteopenia   . Anemia   . Nephrolithiasis     Hx of  . Osteoporosis     fosamax for over 5 yrs.  . Vaginal cyst     stable  . History of repair of mitral valve     proph for dentist   Past Surgical History  Procedure Laterality Date  . Cardiac surgery  1995    mitral valve repair carpenters ring  . Lithotripsy for kidney stones    . Tonsillectomy    . Ruptured ovarian cyst surgery    . Hemorrhoid surgery    . Mitral valve replacement  07/2003  . Skin graft dog bite  2010   History  Substance Use Topics  . Smoking status: Never Smoker   . Smokeless tobacco: Never Used  . Alcohol Use: No   Family History  Problem Relation Age of  Onset  . Colon cancer Mother   . Diabetes Brother    No Known Allergies Current Outpatient Prescriptions on File Prior to Visit  Medication Sig Dispense Refill  . aspirin 325 MG tablet Take 325 mg by mouth every other day.      . Cholecalciferol (VITAMIN D) 2000 UNITS CAPS Take 2 capsules by mouth daily.      . Flaxseed, Linseed, (FLAX SEED OIL) 1000 MG CAPS Take 1 capsule by mouth daily.        No current facility-administered medications on file prior to visit.      Review of Systems Review of Systems  Constitutional: Negative for fever, appetite change, fatigue and unexpected weight change.  Eyes: Negative for pain and visual disturbance.  Respiratory: Negative for cough and shortness of breath.   Cardiovascular: Negative for cp or palpitations    Gastrointestinal: Negative for nausea, diarrhea and constipation.  Genitourinary: Negative for urgency and frequency.  Skin: Negative for pallor or rash   Neurological: Negative for weakness, light-headedness, numbness and headaches.  Hematological: Negative for adenopathy. Does not bruise/bleed easily.  Psychiatric/Behavioral: Negative for dysphoric mood. The patient is not nervous/anxious.         Objective:   Physical Exam  Constitutional: She appears well-developed and well-nourished. No distress.  HENT:  Head: Normocephalic and atraumatic.  Right Ear: External ear normal.  Left Ear: External ear normal.  Mouth/Throat: Oropharynx is clear and moist.  Eyes: Conjunctivae and EOM are normal. Pupils are equal, round, and reactive to light. No scleral icterus.  Neck: Normal range of motion. Neck supple. No JVD present. Carotid bruit is not present. No thyromegaly present.  Cardiovascular: Normal rate, regular rhythm, normal heart sounds and intact distal pulses.  Exam reveals no gallop.   Pulmonary/Chest: Effort normal and breath sounds normal. No respiratory distress. She has no wheezes. She exhibits no tenderness.  Abdominal:  Soft. Bowel sounds are normal. She exhibits no distension, no abdominal bruit and no mass. There is no tenderness.  Genitourinary: No breast swelling, tenderness, discharge or bleeding.  Breast exam: No mass, nodules, thickening, tenderness, bulging, retraction, inflamation, nipple discharge or skin changes noted.  No axillary or clavicular LA.      Musculoskeletal: Normal range of motion. She exhibits no edema and no tenderness.  Lymphadenopathy:    She has no cervical adenopathy.  Neurological: She is alert. She has normal reflexes. No cranial nerve deficit. She exhibits normal muscle tone. Coordination normal.  Skin: Skin is warm and dry. No rash noted. No erythema. No  pallor.  Psychiatric: She has a normal mood and affect.          Assessment & Plan:

## 2013-12-17 NOTE — Patient Instructions (Signed)
Take care of yourself  If you need a referral for your colonoscopy-please let us know when you are ready to schedule it  Keep walking  prevnar pneumonia vaccine today

## 2013-12-17 NOTE — Assessment & Plan Note (Signed)
Improved with diet  Disc goals for lipids and reasons to control them Rev labs with pt Rev low sat fat diet in detail  Enc to keep walking also

## 2013-12-17 NOTE — Assessment & Plan Note (Signed)
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

## 2013-12-17 NOTE — Assessment & Plan Note (Signed)
utd on dexa  Disc need for calcium/ vitamin D/ wt bearing exercise and bone density test every 2 y to monitor Disc safety/ fracture risk in detail   She has taken fosamax in the past  No fractues

## 2013-12-17 NOTE — Assessment & Plan Note (Signed)
Reviewed health habits including diet and exercise and skin cancer prevention Reviewed appropriate screening tests for age  Also reviewed health mt list, fam hx and immunization status , as well as social and family history   See HPI Labs reviewed  Disc drawing up adv directive/living will

## 2013-12-26 ENCOUNTER — Other Ambulatory Visit: Payer: Self-pay

## 2013-12-26 MED ORDER — AMOXICILLIN 500 MG PO CAPS: ORAL_CAPSULE | ORAL | Status: DC

## 2013-12-26 NOTE — Telephone Encounter (Signed)
Please give her 3 refills, thanks

## 2013-12-26 NOTE — Telephone Encounter (Signed)
done

## 2013-12-26 NOTE — Telephone Encounter (Signed)
Pt left v/m requesting refill amoxicillin to [facility].Please advise.

## 2014-03-05 DIAGNOSIS — Y92009 Unspecified place in unspecified non-institutional (private) residence as the place of occurrence of the external cause: Secondary | ICD-10-CM | POA: Insufficient documentation

## 2014-03-05 DIAGNOSIS — Z23 Encounter for immunization: Secondary | ICD-10-CM | POA: Insufficient documentation

## 2014-03-05 DIAGNOSIS — S61209A Unspecified open wound of unspecified finger without damage to nail, initial encounter: Secondary | ICD-10-CM | POA: Insufficient documentation

## 2014-03-05 DIAGNOSIS — Z79899 Other long term (current) drug therapy: Secondary | ICD-10-CM | POA: Insufficient documentation

## 2014-03-05 DIAGNOSIS — M899 Disorder of bone, unspecified: Secondary | ICD-10-CM | POA: Insufficient documentation

## 2014-03-05 DIAGNOSIS — Z87442 Personal history of urinary calculi: Secondary | ICD-10-CM | POA: Insufficient documentation

## 2014-03-05 DIAGNOSIS — Z7982 Long term (current) use of aspirin: Secondary | ICD-10-CM | POA: Insufficient documentation

## 2014-03-05 DIAGNOSIS — R21 Rash and other nonspecific skin eruption: Secondary | ICD-10-CM | POA: Insufficient documentation

## 2014-03-05 DIAGNOSIS — W268XXA Contact with other sharp object(s), not elsewhere classified, initial encounter: Secondary | ICD-10-CM | POA: Insufficient documentation

## 2014-03-05 DIAGNOSIS — Z9889 Other specified postprocedural states: Secondary | ICD-10-CM | POA: Insufficient documentation

## 2014-03-05 DIAGNOSIS — Z862 Personal history of diseases of the blood and blood-forming organs and certain disorders involving the immune mechanism: Secondary | ICD-10-CM | POA: Insufficient documentation

## 2014-03-05 DIAGNOSIS — Z954 Presence of other heart-valve replacement: Secondary | ICD-10-CM | POA: Insufficient documentation

## 2014-03-05 DIAGNOSIS — Z8742 Personal history of other diseases of the female genital tract: Secondary | ICD-10-CM | POA: Insufficient documentation

## 2014-03-05 DIAGNOSIS — Y9301 Activity, walking, marching and hiking: Secondary | ICD-10-CM | POA: Insufficient documentation

## 2014-03-05 DIAGNOSIS — M810 Age-related osteoporosis without current pathological fracture: Secondary | ICD-10-CM | POA: Insufficient documentation

## 2014-03-05 DIAGNOSIS — M949 Disorder of cartilage, unspecified: Secondary | ICD-10-CM

## 2014-03-06 ENCOUNTER — Emergency Department (HOSPITAL_COMMUNITY): Payer: Medicare Other

## 2014-03-06 ENCOUNTER — Emergency Department (HOSPITAL_COMMUNITY)
Admission: EM | Admit: 2014-03-06 | Discharge: 2014-03-06 | Disposition: A | Discharge status: Home / Self Care | Payer: Medicare Other | Attending: Emergency Medicine | Admitting: Emergency Medicine

## 2014-03-06 DIAGNOSIS — T798XXA Other early complications of trauma, initial encounter: Secondary | ICD-10-CM

## 2014-03-06 MED ORDER — TRAMADOL HCL 50 MG PO TABS: 50.0000 mg | ORAL_TABLET | Freq: Four times a day (QID) | ORAL | Status: DC | PRN

## 2014-03-06 MED ORDER — AMOXICILLIN-POT CLAVULANATE 875-125 MG PO TABS: 1.0000 | ORAL_TABLET | Freq: Two times a day (BID) | ORAL | Status: DC

## 2014-03-06 MED ORDER — AMPICILLIN-SULBACTAM SODIUM 3 (2-1) G IJ SOLR
3.0000 g | Freq: Once | INTRAMUSCULAR | Status: AC
  Administered 2014-03-06: 3 g via INTRAVENOUS
  Filled 2014-03-06: qty 3

## 2014-03-06 MED ORDER — TETANUS-DIPHTH-ACELL PERTUSSIS 5-2.5-18.5 LF-MCG/0.5 IM SUSP
0.5000 mL | Freq: Once | INTRAMUSCULAR | Status: AC
  Administered 2014-03-06: 0.5 mL via INTRAMUSCULAR
  Filled 2014-03-06: qty 0.5

## 2014-03-06 NOTE — Discharge Instructions (Signed)
Followup in the emergency department or with your primary MD in 24 hours to have your wound reassessed. If you notice increased redness, increased streaking, increased pain, fever or any concerns please return immediately to the emergency department. Take antibiotics as prescribed Wound Infection A wound infection happens when a type of germ (bacteria) starts growing in the wound. In some cases, this can cause the wound to break open. If cared for properly, the infected wound will heal from the inside to the outside. Wound infections need treatment. CAUSES An infection is caused by bacteria growing in the wound.  SYMPTOMS   Increase in redness, swelling, or pain at the wound site.  Increase in drainage at the wound site.  Wound or bandage (dressing) starts to smell bad.  Fever.  Feeling tired or fatigued.  Pus draining from the wound. TREATMENT  Your health care provider will prescribe antibiotic medicine. The wound infection should improve within 24 to 48 hours. Any redness around the wound should stop spreading and the wound should be less painful.  HOME CARE INSTRUCTIONS   Only take over-the-counter or prescription medicines for pain, discomfort, or fever as directed by your health care provider.  Take your antibiotics as directed. Finish them even if you start to feel better.  Gently wash the area with mild soap and water 2 times a day, or as directed. Rinse off the soap. Pat the area dry with a clean towel. Do not rub the wound. This may cause bleeding.  Follow your health care provider's instructions for how often you need to change the dressing.  Apply ointment and a dressing to the wound as directed.  If the dressing sticks, moisten it with soapy water and gently remove it.  Change the bandage right away if it becomes wet, dirty, or develops a bad smell.  Take showers. Do not take tub baths, swim, or do anything that may soak the wound until it is healed.  Avoid  exercises that make you sweat heavily.  Use anti-itch medicine as directed by your health care provider. The wound may itch when it is healing. Do not pick or scratch at the wound.  Follow up with your health care provider to get your wound rechecked as directed. SEEK MEDICAL CARE IF:  You have an increase in swelling, pain, or redness around the wound.  You have an increase in the amount of pus coming from the wound.  There is a bad smell coming from the wound.  More of the wound breaks open.  You have a fever. MAKE SURE YOU:   Understand these instructions.  Will watch your condition.  Will get help right away if you are not doing well or get worse. Document Released: 05/15/2003 Document Revised: 08/21/2013 Document Reviewed: 12/20/2010 [facility] [place] Patient Information 2015 [place], [place]. This information is not intended to replace advice given to you by your health care provider. Make sure you discuss any questions you have with your health care provider.

## 2014-03-06 NOTE — ED Provider Notes (Signed)
CSN: [id number]     Arrival date & time 03/05/14  2347 History  This chart was scribed for Julianne Rice, MD by Lowella Petties, ED Scribe. The patient was seen in room APA07/APA07. Patient's care was started at 12:39 AM.      Chief Complaint  Patient presents with  . Foot Injury   HPI HPI Comments: Angelica Rivers is a 68 y.o. female who presents to the Emergency Department complaining of a right foot injury that occurred 12 hours ago. She states that she was walking in her yard and stepped on a stick causing a small indentation. She reports that the area became red, inflamed, and painful touch. She reports washing it out with peroxide. She denies fever and chills.   Past Medical History  Diagnosis Date  . Osteopenia   . Anemia   . Nephrolithiasis     Hx of  . Osteoporosis     fosamax for over 5 yrs.  . Vaginal cyst     stable  . History of repair of mitral valve     proph for dentist   Past Surgical History  Procedure Laterality Date  . Cardiac surgery  1995    mitral valve repair carpenters ring  . Lithotripsy for kidney stones    . Tonsillectomy    . Ruptured ovarian cyst surgery    . Hemorrhoid surgery    . Mitral valve replacement  07/2003  . Skin graft dog bite  2010   Family History  Problem Relation Age of Onset  . Colon cancer Mother   . Diabetes Brother    History  Substance Use Topics  . Smoking status: Never Smoker   . Smokeless tobacco: Never Used  . Alcohol Use: No   OB History   Grav Para Term Preterm Abortions TAB SAB Ect Mult Living                 Review of Systems  Constitutional: Negative for fever and chills.  Gastrointestinal: Negative for nausea and vomiting.  Skin: Positive for rash.  Neurological: Negative for weakness and numbness.  All other systems reviewed and are negative.     Allergies  Review of patient's allergies indicates no known allergies.  Home Medications   Prior to Admission medications   Medication Sig  Start Date End Date Taking? Authorizing Provider  aspirin 325 MG tablet Take 325 mg by mouth every other day.   Yes Historical Provider, MD  Cholecalciferol (VITAMIN D) 2000 UNITS CAPS Take 2 capsules by mouth daily.   Yes Historical Provider, MD  Flaxseed, Linseed, (FLAX SEED OIL) 1000 MG CAPS Take 1 capsule by mouth daily.    Yes Historical Provider, MD  amoxicillin (AMOXIL) 500 MG capsule Take 4 pills by mouth before dental appt 12/26/13   Abner Greenspan, MD   Triage Vitals: BP 139/73  Pulse 89  Temp(Src) 97.7 F (36.5 C) (Oral)  Resp 16  Ht 5\' 3"  (1.6 m)  Wt 135 lb (61.236 kg)  BMI 23.92 kg/m2  SpO2 98% Physical Exam  Nursing note and vitals reviewed. Constitutional: She is oriented to person, place, and time. She appears well-developed and well-nourished. No distress.  HENT:  Head: Normocephalic and atraumatic.  Eyes: EOM are normal. Pupils are equal, round, and reactive to light.  Neck: Normal range of motion. Neck supple.  Cardiovascular: Normal rate and regular rhythm.   Pulmonary/Chest: Effort normal and breath sounds normal.  Abdominal: Soft. Bowel sounds are normal.  Musculoskeletal: Normal range of motion. She exhibits tenderness. She exhibits no edema.  Patient has a small puncture wound to the lateral surface of her right foot at the level of the MCP. There is mild surrounding erythema and streaking up the dorsum of the foot. There is tenderness to palpation surrounding the puncture wound but do not appreciate any foreign bodies. It is not grossly contaminated.  Neurological: She is alert and oriented to person, place, and time.  Skin: Skin is warm and dry. No rash noted. No erythema.  Psychiatric: She has a normal mood and affect. Her behavior is normal.    ED Course  Procedures (including critical care time) DIAGNOSTIC STUDIES: Oxygen Saturation is 98% on room air, normal by my interpretation.    COORDINATION OF CARE: Labs Review Labs Reviewed - No data to  display  Imaging Review No results found.   EKG Interpretation None      MDM   Final diagnoses:  None   I personally performed the services described in this documentation, which was scribed in my presence. The recorded information has been reviewed and is accurate.   Patient with puncture wound to her right foot and what appears to be early infection. Will give IV antibiotics and updated her tetanus. X-ray without any foreign body findings. Patient will need to be reevaluated in 24 hours either by her primary physician or in the emergency department. She's also been given very thorough instructions to return for worsening redness, constitutional symptoms including fever chills or for any concerns.   Julianne Rice, MD 03/07/14 [phone]

## 2014-03-06 NOTE — ED Notes (Signed)
Patient verbalizes understanding of discharge instructions, follow up care and prescription medications. Patient ambulatory out of department at this time with family members.

## 2014-03-06 NOTE — ED Notes (Signed)
Patient states she was walking in grass today wearing flip flops and she felt something on her right inner foot. Patient states she noticed some pink tinged fluid from wound. Patient states she "flushed it good" this evening, but noticed some redness and swelling to site. + pulse to affected extremity and patient is able to bear weight, but it is painful.

## 2014-03-06 NOTE — ED Notes (Signed)
Pt has small puncture wound to inside of ball of right foot, states this happened approx 12 noon, and since then it is throbbing and has swollen some.

## 2014-03-07 ENCOUNTER — Encounter: Payer: Self-pay | Admitting: Family Medicine

## 2014-03-07 ENCOUNTER — Ambulatory Visit (INDEPENDENT_AMBULATORY_CARE_PROVIDER_SITE_OTHER): Payer: Medicare Other | Admitting: Family Medicine

## 2014-03-07 VITALS — BP 130/72 | HR 88 | Temp 98.1°F | Ht 65.25 in | Wt 138.0 lb

## 2014-03-07 DIAGNOSIS — L03119 Cellulitis of unspecified part of limb: Secondary | ICD-10-CM

## 2014-03-07 DIAGNOSIS — L02419 Cutaneous abscess of limb, unspecified: Secondary | ICD-10-CM

## 2014-03-07 DIAGNOSIS — L03115 Cellulitis of right lower limb: Secondary | ICD-10-CM

## 2014-03-07 MED ORDER — CEFTRIAXONE SODIUM 1 G IJ SOLR
1.0000 g | Freq: Once | INTRAMUSCULAR | Status: AC
  Administered 2014-03-07: 1 g via INTRAMUSCULAR

## 2014-03-07 MED ORDER — LEVOFLOXACIN 500 MG PO TABS: 500.0000 mg | ORAL_TABLET | Freq: Every day | ORAL | Status: DC

## 2014-03-07 NOTE — Progress Notes (Signed)
[place] [address] Phone: [phone] Fax: [phone]  Patient ID: Angelica Rivers MRN: [id number], DOB: [birth year], 68 y.o. Date of Encounter: 03/07/2014  Primary Physician:  Loura Pardon, MD   Chief Complaint: Follow-up   Subjective:   History of Present Illness:  Angelica Rivers is a 68 y.o. very pleasant female patient who presents with the following:  F/u ER puncture wound, on augmentin: pt of Dr. Glori Bickers.  Now on augmentin, 875 po bid. Went to the ER on Wed. Morning.  Add LVQ Rocephin 1 gram IM  Pleasant patient who sustained a puncture wound on the medial aspect of her RIGHT foot on March 05, 2014. She subsequently went to the emergency room at [facility], and was evaluated there. There appeared to be no radiopaque body on plain film.  She has been placed on Augmentin 875 mg p.o. B.i.d., and now she is on day 2 on antibiotics.  Past Medical History, Surgical History, Social History, Family History, Problem List, Medications, and Allergies have been reviewed and updated if relevant.  Review of Systems:  GEN: No acute illnesses, no fevers, chills. GI: No n/v/d, eating normally Pulm: No SOB Interactive and getting along well at home.  Otherwise, ROS is as per the HPI.  Objective:   Physical Examination: BP 130/72  Pulse 88  Temp(Src) 98.1 F (36.7 C) (Oral)  Ht 5' 5.25" (1.657 m)  Wt 138 lb (62.596 kg)  BMI 22.80 kg/m2   GEN: WDWN, NAD, Non-toxic, A & O x 3 HEENT: Atraumatic, Normocephalic. Neck supple. No masses, No LAD. Ears and Nose: No external deformity. CV: RRR, No M/G/R. No JVD. No thrill. No extra heart sounds. PULM: CTA B, no wheezes, crackles, rhonchi. No retractions. No resp. distress. No accessory muscle use. EXTR: No c/c/e NEURO Normal gait.  PSYCH: Normally interactive. Conversant. Not depressed or anxious appearing.  Calm demeanor.   On the RIGHT foot there is a small opening that corresponds to puncture wound, and  there is some surrounding redness. There is a very small area of blackish discoloration near the puncture site that is 1-2 mm in size. There is some surrounding redness and warmth. Clearly demarcated borders from emergency room. There is some warmth but extends beyond this. And this was marked with permanent marker.  Laboratory and Imaging Data:  Dg Foot Complete Right  03/06/2014   CLINICAL DATA:  Right foot pain with redness and swelling. Small wound to the medial first MTP joint.  EXAM: RIGHT FOOT COMPLETE - 3+ VIEW  COMPARISON:  None.  FINDINGS: Degenerative changes in the first metatarsal-phalangeal joint with soft tissue swelling. No bone erosion or fracture is demonstrated. No radiopaque soft tissue foreign bodies.  IMPRESSION: Degenerative changes and soft tissue swelling at the first metatarsal-phalangeal joint.   Electronically Signed   By: Lucienne Capers M.D.   On: 03/06/2014 01:46    Assessment & Plan:   Cellulitis of right lower extremity - Plan: cefTRIAXone (ROCEPHIN) injection 1 g  Worsening cellulitis, RIGHT lower extremity. Puncture wound.  Give the patient 1 g of Rocephin in the office.  She has good anaerobic coverage, and I'm going to also add some Levaquin for additional coverage and pseudomonal coverage given its location in the foot. She appears stable now.  She will f/u with Dr. Diona Browner tomorrow. If she continues to worsen, IV antibiotics and admission may be needed.   New Prescriptions   LEVOFLOXACIN (LEVAQUIN) 500 MG TABLET    Take  1 tablet (500 mg total) by mouth daily.   Modified Medications   No medications on file   No orders of the defined types were placed in this encounter.   Follow-up: Return for TOMORROW AFTERNOON.Marland Kitchen Unless noted above, the patient is to follow-up if symptoms worsen. Red flags were reviewed with the patient.  Signed,  Maud Deed. Jazen Spraggins, MD, CAQ Sports Medicine   Discontinued Medications   No medications on file   Current  Medications at Discharge:   Medication List       This list is accurate as of: 03/07/14 11:59 PM.  Always use your most recent med list.               amoxicillin 500 MG capsule  Commonly known as:  AMOXIL  Take 4 pills by mouth before dental appt     amoxicillin-clavulanate 875-125 MG per tablet  Commonly known as:  AUGMENTIN  Take 1 tablet by mouth 2 (two) times daily. One po bid x 7 days     aspirin 325 MG tablet  Take 325 mg by mouth every other day.     Flax Seed Oil 1000 MG Caps  Take 1 capsule by mouth daily.     levofloxacin 500 MG tablet  Commonly known as:  LEVAQUIN  Take 1 tablet (500 mg total) by mouth daily.     traMADol 50 MG tablet  Commonly known as:  ULTRAM  Take 1 tablet (50 mg total) by mouth every 6 (six) hours as needed.     Vitamin D 2000 UNITS Caps  Take 2 capsules by mouth daily.

## 2014-03-07 NOTE — Progress Notes (Signed)
Pre visit review using our clinic review tool, if applicable. No additional management support is needed unless otherwise documented below in the visit note. 

## 2014-03-08 ENCOUNTER — Encounter: Payer: Self-pay | Admitting: Family Medicine

## 2014-03-08 ENCOUNTER — Ambulatory Visit (INDEPENDENT_AMBULATORY_CARE_PROVIDER_SITE_OTHER): Payer: Medicare Other | Admitting: Family Medicine

## 2014-03-08 VITALS — BP 110/66 | HR 96 | Temp 98.1°F | Ht 65.25 in | Wt 136.2 lb

## 2014-03-08 DIAGNOSIS — L02619 Cutaneous abscess of unspecified foot: Secondary | ICD-10-CM

## 2014-03-08 DIAGNOSIS — L03119 Cellulitis of unspecified part of limb: Secondary | ICD-10-CM

## 2014-03-08 DIAGNOSIS — L03116 Cellulitis of left lower limb: Secondary | ICD-10-CM | POA: Insufficient documentation

## 2014-03-08 NOTE — Progress Notes (Signed)
Angelica Rivers is a 68 y.o.  Female NONDIABETIC  patient of Dr. Marliss Coots who presents with the following:   F/u ER puncture wound. Now on augmentin, 875 po bid. Went to the ER on 03/06/2014 She had sustained a puncture wound on the medial aspect of her RIGHT foot on March 05, 2014. She subsequently went to the emergency room at [facility], and was evaluated there. There appeared to be no radiopaque body on plain film.  She has been placed on Augmentin 875 mg p.o. B.i.d., .  She had worsening symptoms  In follow up on 7/9 with Dr. Lorelei Pont.  He was given rocephin 1 g x 1.  Added levaquin for pseudomonal coverage.  Today she reports significant improvement from yesterday. Less redness, less heat, no pain. No fever.  Past Medical History, Surgical History, Social History, Family History, Problem List, Medications, and Allergies have been reviewed and updated if relevant.  Review of Systems:  GEN: No acute illnesses, no fevers, chills.  GI: No n/v/d, eating normally  Pulm: No SOB  Interactive and getting along well at home.  Otherwise, ROS is as per the HPI.  Objective:   Physical Examination:  BP 130/72  Pulse 88  Temp(Src) 98.1 F (36.7 C) (Oral)  Ht 5' 5.25" (1.657 m)  Wt 138 lb (62.596 kg)  BMI 22.80 kg/m2  GEN: WDWN, NAD, Non-toxic, A & O x 3  HEENT: Atraumatic, Normocephalic. Neck supple. No masses, No LAD.  Ears and Nose: No external deformity.  CV: RRR, No M/G/R. No JVD. No thrill. No extra heart sounds.  PULM: CTA B, no wheezes, crackles, rhonchi. No retractions. No resp. distress. No accessory muscle use.  EXTR: No c/c/e  NEURO Normal gait.  PSYCH: Normally interactive. Conversant. Not depressed or anxious appearing. Calm demeanor.   SKIN:On the RIGHT foot there is a small opening that corresponds to puncture wound, and there is some surrounding redness. There is a very small area of blackish discoloration near the puncture site that is 1-2 mm in size. There is some  surrounding redness and warmth, but much less than yesterday per pt, only 1 cm around puncture..no extension past marker Clearly demarcated borders from emergency room.   Laboratory and Imaging Data:  Dg Foot Complete Right  03/06/2014 CLINICAL DATA: Right foot pain with redness and swelling. Small wound to the medial first MTP joint. EXAM: RIGHT FOOT COMPLETE - 3+ VIEW COMPARISON: None. FINDINGS: Degenerative changes in the first metatarsal-phalangeal joint with soft tissue swelling. No bone erosion or fracture is demonstrated. No radiopaque soft tissue foreign bodies. IMPRESSION: Degenerative changes and soft tissue swelling at the first metatarsal-phalangeal joint. Electronically Signed By: Lucienne Capers M.D. On: 03/06/2014 01:46  Assessment & Plan:

## 2014-03-08 NOTE — Progress Notes (Signed)
Pre visit review using our clinic review tool, if applicable. No additional management support is needed unless otherwise documented below in the visit note. 

## 2014-03-08 NOTE — Patient Instructions (Signed)
Complete antibiotics levaquin and augmentin.  Push fluids.  Call if redness spreading, increased pain, or fever.

## 2014-03-08 NOTE — Assessment & Plan Note (Signed)
Improving on broader antibiotics. Recommended completion of Augmentin and Levaquin.

## 2014-03-14 ENCOUNTER — Encounter: Payer: Self-pay | Admitting: Gastroenterology

## 2014-04-10 ENCOUNTER — Ambulatory Visit: Payer: Medicare Other | Admitting: Internal Medicine

## 2014-04-10 ENCOUNTER — Ambulatory Visit (INDEPENDENT_AMBULATORY_CARE_PROVIDER_SITE_OTHER)
Admission: RE | Admit: 2014-04-10 | Discharge: 2014-04-10 | Disposition: A | Discharge status: Home / Self Care | Payer: Medicare Other | Source: Ambulatory Visit | Attending: Family Medicine | Admitting: Family Medicine

## 2014-04-10 ENCOUNTER — Encounter: Payer: Self-pay | Admitting: Family Medicine

## 2014-04-10 ENCOUNTER — Ambulatory Visit (INDEPENDENT_AMBULATORY_CARE_PROVIDER_SITE_OTHER): Payer: Medicare Other | Admitting: Family Medicine

## 2014-04-10 VITALS — BP 96/60 | HR 91 | Temp 97.9°F | Ht 65.25 in | Wt 137.5 lb

## 2014-04-10 DIAGNOSIS — M79609 Pain in unspecified limb: Secondary | ICD-10-CM

## 2014-04-10 DIAGNOSIS — M79671 Pain in right foot: Secondary | ICD-10-CM

## 2014-04-10 NOTE — Progress Notes (Signed)
Pre visit review using our clinic review tool, if applicable. No additional management support is needed unless otherwise documented below in the visit note. 

## 2014-04-11 ENCOUNTER — Encounter: Payer: Self-pay | Admitting: Podiatry

## 2014-04-11 ENCOUNTER — Ambulatory Visit (INDEPENDENT_AMBULATORY_CARE_PROVIDER_SITE_OTHER): Payer: Medicare Other | Admitting: Podiatry

## 2014-04-11 ENCOUNTER — Other Ambulatory Visit: Payer: Self-pay | Admitting: Podiatry

## 2014-04-11 VITALS — BP 126/78 | HR 88 | Resp 16 | Ht 65.0 in | Wt 137.0 lb

## 2014-04-11 DIAGNOSIS — L03119 Cellulitis of unspecified part of limb: Principal | ICD-10-CM

## 2014-04-11 DIAGNOSIS — L02619 Cutaneous abscess of unspecified foot: Secondary | ICD-10-CM

## 2014-04-11 NOTE — Progress Notes (Signed)
   Subjective:    Patient ID: Angelica Rivers, female    DOB: 1945/11/16, 68 y.o.   MRN: [id number]  HPI Comments: "I think I was bit by a spider"  Patient c/o aching 1st MPJ right for about a month. She states that she was bit by a spider and has been on rounds of antibiotic and it hasn't healed. Xrays didn't show foreign body. The area is still swollen and red and also has a pus filled pocket.  Foot Pain      Review of Systems  All other systems reviewed and are negative.      Objective:   Physical Exam        Assessment & Plan:

## 2014-04-11 NOTE — Progress Notes (Signed)
[place] [address] Phone: [phone] Fax: [phone]  Patient ID: Angelica Rivers MRN: [id number], DOB: [birth year], 68 y.o. Date of Encounter: 04/10/2014  Primary Physician:  Loura Pardon, MD   Chief Complaint: Wound Check   Subjective:   History of Present Illness:  Angelica Rivers is a 68 y.o. very pleasant female patient who presents with the following:  Patient was seen in 1 month ago for ? Puncture wound, ? "spider bite" and ws felt to have cellulitis. Initially given some ABX in the ER, subsequently was on Augmentin. At time of last OV, I added LVQ to cover for pseudomonas.   She is now here 1 month later, feeling much better but with still some persistent pan in the area and a blister-like area and redness.   03/07/2014 Last OV with Owens Loffler, MD  F/u ER puncture wound, on augmentin: pt of Dr. Glori Bickers.  Now on augmentin, 875 po bid. Went to the ER on Wed. Morning.  Add LVQ Rocephin 1 gram IM  Pleasant patient who sustained a puncture wound on the medial aspect of her RIGHT foot on March 05, 2014. She subsequently went to the emergency room at [facility], and was evaluated there. There appeared to be no radiopaque body on plain film.  She has been placed on Augmentin 875 mg p.o. B.i.d., and now she is on day 2 on antibiotics.  Past Medical History, Surgical History, Social History, Family History, Problem List, Medications, and Allergies have been reviewed and updated if relevant.  Review of Systems:  GEN: No acute illnesses, no fevers, chills. GI: No n/v/d, eating normally Pulm: No SOB Interactive and getting along well at home.  Otherwise, ROS is as per the HPI.  Objective:   Physical Examination: BP 96/60  Pulse 91  Temp(Src) 97.9 F (36.6 C) (Oral)  Ht 5' 5.25" (1.657 m)  Wt 137 lb 8 oz (62.37 kg)  BMI 22.72 kg/m2   GEN: WDWN, NAD, Non-toxic, A & O x 3 HEENT: Atraumatic, Normocephalic. Neck supple. No masses, No LAD. Ears  and Nose: No external deformity. CV: RRR, No M/G/R. No JVD. No thrill. No extra heart sounds. PULM: CTA B, no wheezes, crackles, rhonchi. No retractions. No resp. distress. No accessory muscle use. EXTR: No c/c/e PSYCH: Normally interactive. Conversant. Not depressed or anxious appearing.  Calm demeanor.   On the RIGHT foot there is a small opening that corresponds to puncture wound, and there is some surrounding whitish skin with ? Fluid underneath approx 1 cm across.. There is some surrounding redness and mild tenderness. No warmth.  Laboratory and Imaging Data: Dg Foot Complete Right  04/11/2014   CLINICAL DATA:  Right-sided foot pain, possible spider bite or foreign body; history of wound in the region of the medial aspect of the first metatarsophalangeal joint  EXAM: RIGHT FOOT COMPLETE - 3+ VIEW  COMPARISON:  Right foot series of March 06, 2014.  FINDINGS: The bones of the right foot exhibit no lytic or blastic lesion or periosteal reaction. There is mild narrowing of the first metatarsophalangeal joint consistent with osteoarthritis. There is persistent mild swelling of the soft tissues over the medial aspect of the first metatarsophalangeal joint. There is no radiopaque foreign body or soft tissue gas. Elsewhere the bones of the foot exhibit no acute abnormalities. There is a plantar calcaneal spur.  IMPRESSION: There is persistent soft tissue swelling over the medial aspect of the first metatarsophalangeal joint. There is no foreign  body or soft tissue gas nor evidence of osteomyelitis.   Electronically Signed   By: David  Martinique   On: 04/11/2014 08:26    Assessment & Plan:   Right foot pain - Plan: DG Foot Complete Right, Ambulatory referral to Podiatry  I am unclear why this is not better, possible FB that would not be radioopaque? Appropriate ABX treatment. I had one of my parters examine as well, and we both agreed. I would like to get podiatry's opinion on this case. Appreciate  help.  Orders Placed This Encounter  Procedures  . DG Foot Complete Right  . Ambulatory referral to Podiatry   Follow-up: No Follow-up on file. Unless noted above, the patient is to follow-up if symptoms worsen. Red flags were reviewed with the patient.  Signed,  Maud Deed. Branda Chaudhary, MD, CAQ Sports Medicine   Discontinued Medications   AMOXICILLIN (AMOXIL) 500 MG CAPSULE    Take 4 pills by mouth before dental appt   AMOXICILLIN-CLAVULANATE (AUGMENTIN) 875-125 MG PER TABLET    Take 1 tablet by mouth 2 (two) times daily. One po bid x 7 days   LEVOFLOXACIN (LEVAQUIN) 500 MG TABLET    Take 1 tablet (500 mg total) by mouth daily.

## 2014-04-12 NOTE — Progress Notes (Signed)
Subjective:     Patient ID: Angelica Rivers, female   DOB: 1946/01/07, 68 y.o.   MRN: [id number]  Foot Pain   patient states she's been having trouble around her first metatarsal for about a month after being most likely bit by a spider and she's been on rounds of antibiotics and it still continues to give her trouble even though she states the redness is not as bad and it's not as swollen   Review of Systems  All other systems reviewed and are negative.      Objective:   Physical Exam  Nursing note and vitals reviewed. Constitutional: She is oriented to person, place, and time.  Cardiovascular: Intact distal pulses.   Musculoskeletal: Normal range of motion.  Neurological: She is oriented to person, place, and time.  Skin: Skin is warm and dry.   neurovascular status intact with muscle strength adequate in range of motion within normal limits. On the medial aspect of the right first metatarsal there is a small opening with obvious purulent fluid and it is localized with minimal erythema edema surrounding it with everything been localized at the current time. No proximal edema erythema or drainage    Assessment:     Probable localized abscess occurring right first metatarsal that up to this point has not been cultured    Plan:     H&P and x-ray reviewed. I did debridement of the area and I was able to do a culture of the abscess tissue and I then flushed and packed with Iodosorb. She will use this for the next 3 days and then reappoint and will be seen back in the next couple weeks. There is no proximal edema erythema or drainage and if anything were to occur she is to let us know immediately or if she should develop systemic signs of infection she is to go straight to the emergency room

## 2014-04-14 LAB — WOUND CULTURE
GRAM STAIN: NONE SEEN
Gram Stain: NONE SEEN

## 2014-04-18 ENCOUNTER — Ambulatory Visit (INDEPENDENT_AMBULATORY_CARE_PROVIDER_SITE_OTHER): Payer: Medicare Other | Admitting: Podiatry

## 2014-04-18 ENCOUNTER — Encounter: Payer: Self-pay | Admitting: Podiatry

## 2014-04-18 VITALS — BP 122/68 | HR 85 | Resp 16

## 2014-04-18 DIAGNOSIS — L02619 Cutaneous abscess of unspecified foot: Secondary | ICD-10-CM

## 2014-04-18 DIAGNOSIS — L03119 Cellulitis of unspecified part of limb: Principal | ICD-10-CM

## 2014-04-21 NOTE — Progress Notes (Signed)
Subjective:     Patient ID: Angelica Rivers, female   DOB: 02/24/1946, 68 y.o.   MRN: [id number]  HPI patient states that my foot feels much better  Review of Systems     Objective:   Physical Exam Neurovascular unchanged with improvement with reduced drainage and reduced erythema noted in the foot in general    Assessment:     Improved from cellulitic abscess condition with localized treatment    Plan:     Advised on continuation of soaks and medication and if any redness or swelling or drainage should occur patient is to reappoint immediately or any systemic indications of infection were to occur she is to go to the emergency room for IV treatment

## 2014-06-19 ENCOUNTER — Encounter: Payer: Self-pay | Admitting: Family Medicine

## 2014-06-21 ENCOUNTER — Encounter: Payer: Self-pay | Admitting: *Deleted

## 2014-06-28 ENCOUNTER — Other Ambulatory Visit: Payer: Self-pay | Admitting: *Deleted

## 2014-06-28 NOTE — Telephone Encounter (Signed)
Opened in error

## 2014-08-13 ENCOUNTER — Ambulatory Visit (INDEPENDENT_AMBULATORY_CARE_PROVIDER_SITE_OTHER)
Admission: RE | Admit: 2014-08-13 | Discharge: 2014-08-13 | Disposition: A | Discharge status: Home / Self Care | Payer: Medicare Other | Source: Ambulatory Visit | Attending: Family Medicine | Admitting: Family Medicine

## 2014-08-13 ENCOUNTER — Encounter: Payer: Self-pay | Admitting: Family Medicine

## 2014-08-13 ENCOUNTER — Ambulatory Visit (INDEPENDENT_AMBULATORY_CARE_PROVIDER_SITE_OTHER): Payer: Medicare Other | Admitting: Family Medicine

## 2014-08-13 VITALS — BP 130/84 | HR 82 | Temp 98.0°F | Ht 65.0 in | Wt 135.2 lb

## 2014-08-13 DIAGNOSIS — M25561 Pain in right knee: Secondary | ICD-10-CM

## 2014-08-13 NOTE — Patient Instructions (Signed)
Knee xray today  Try aleve 1-2 pills with food in evening for night time pain  Also ice when needed  Elevate when you can   We will make a plan after xray report returns

## 2014-08-13 NOTE — Progress Notes (Signed)
Pre visit review using our clinic review tool, if applicable. No additional management support is needed unless otherwise documented below in the visit note. 

## 2014-08-13 NOTE — Progress Notes (Signed)
Subjective:    Patient ID: Angelica Rivers, female    DOB: 1946-02-13, 68 y.o.   MRN: [id number]  HPI Here for knee problems that started 2 weeks ago   Swelling (mild medially) Pain -also medially No injury recently    (fell several years ago) No redness/ has been warm  Hurts to lie in bed at night  occ stiff after inactivity Does ok with regular walking  ? About stairs-perhaps a bit worse   Patient Active Problem List   Diagnosis Date Noted  . Cellulitis of left foot 03/08/2014  . Colon cancer screening 12/17/2013  . Encounter for Medicare annual wellness exam 12/08/2013  . Routine general medical examination at a health care facility 12/16/2012  . Gynecological examination 10/13/2011  . UNSPECIFIED VITAMIN D DEFICIENCY 09/23/2009  . THROMBOCYTOPENIA 09/23/2009  . HYPERLIPIDEMIA 09/12/2008  . FIBROCYSTIC BREAST DISEASE 09/08/2007  . Osteopenia 09/08/2007  . NEPHROLITHIASIS, HX OF 09/08/2007   Past Medical History  Diagnosis Date  . Osteopenia   . Anemia   . Nephrolithiasis     Hx of  . Osteoporosis     fosamax for over 5 yrs.  . Vaginal cyst     stable  . History of repair of mitral valve     proph for dentist   Past Surgical History  Procedure Laterality Date  . Cardiac surgery  1995    mitral valve repair carpenters ring  . Lithotripsy for kidney stones    . Tonsillectomy    . Ruptured ovarian cyst surgery    . Hemorrhoid surgery    . Mitral valve replacement  07/2003  . Skin graft dog bite  2010   History  Substance Use Topics  . Smoking status: Never Smoker   . Smokeless tobacco: Never Used  . Alcohol Use: No   Family History  Problem Relation Age of Onset  . Colon cancer Mother   . Diabetes Brother    No Known Allergies Current Outpatient Prescriptions on File Prior to Visit  Medication Sig Dispense Refill  . aspirin 325 MG tablet Take 325 mg by mouth every other day.    . Cholecalciferol (VITAMIN D) 2000 UNITS CAPS Take 2 capsules by  mouth daily.    . Flaxseed, Linseed, (FLAX SEED OIL) 1000 MG CAPS Take 1 capsule by mouth daily.      No current facility-administered medications on file prior to visit.      Review of Systems Review of Systems  Constitutional: Negative for fever, appetite change, fatigue and unexpected weight change.  Eyes: Negative for pain and visual disturbance.  Respiratory: Negative for cough and shortness of breath.   Cardiovascular: Negative for cp or palpitations    Gastrointestinal: Negative for nausea, diarrhea and constipation.  Genitourinary: Negative for urgency and frequency.  Skin: Negative for pallor or rash   MSK pos for right knee pain / neg for other joint complaints  Neurological: Negative for weakness, light-headedness, numbness and headaches.  Hematological: Negative for adenopathy. Does not bruise/bleed easily.  Psychiatric/Behavioral: Negative for dysphoric mood. The patient is not nervous/anxious.         Objective:   Physical Exam  Constitutional: She appears well-developed and well-nourished. No distress.  HENT:  Head: Normocephalic and atraumatic.  Eyes: Conjunctivae and EOM are normal. Pupils are equal, round, and reactive to light.  Neck: Normal range of motion. Neck supple.  Cardiovascular: Normal rate and regular rhythm.   Musculoskeletal: She exhibits edema and tenderness.  Right knee: She exhibits decreased range of motion and swelling. She exhibits no effusion, no ecchymosis, no erythema, normal alignment, no LCL laxity, normal patellar mobility and normal meniscus. Tenderness found. Medial joint line tenderness noted.  R knee- also tender posteriorly-unable to palp cyst or swelling there  Mild medial swelling No eff Nl ant drawer/lachman exam  Lymphadenopathy:    She has no cervical adenopathy.  Skin: Skin is warm and dry. No rash noted. No erythema.  Psychiatric: She has a normal mood and affect.          Assessment & Plan:   Problem List  Items Addressed This Visit      Other   Right knee pain - Primary    Medially with some soft tissue swelling  No trauma other than fall several years ago  Suspect poss OA  Xray today Trial of aleve at night as needed with food  Ice/elevate  Plan from there     Relevant Orders      DG Knee Complete 4 Views Right (Completed)

## 2014-08-13 NOTE — Assessment & Plan Note (Signed)
Medially with some soft tissue swelling  No trauma other than fall several years ago  Suspect poss OA  Xray today Trial of aleve at night as needed with food  Ice/elevate  Plan from there

## 2014-08-21 ENCOUNTER — Ambulatory Visit (INDEPENDENT_AMBULATORY_CARE_PROVIDER_SITE_OTHER): Payer: Medicare Other | Admitting: Family Medicine

## 2014-08-21 ENCOUNTER — Encounter: Payer: Self-pay | Admitting: Family Medicine

## 2014-08-21 VITALS — BP 120/68 | HR 100 | Temp 98.4°F | Ht 65.0 in | Wt 136.0 lb

## 2014-08-21 DIAGNOSIS — M1711 Unilateral primary osteoarthritis, right knee: Secondary | ICD-10-CM

## 2014-08-21 MED ORDER — TRAMADOL HCL 50 MG PO TABS: 50.0000 mg | ORAL_TABLET | Freq: Four times a day (QID) | ORAL | Status: DC | PRN

## 2014-08-21 NOTE — Progress Notes (Signed)
Pre visit review using our clinic review tool, if applicable. No additional management support is needed unless otherwise documented below in the visit note. 

## 2014-08-21 NOTE — Progress Notes (Signed)
Dr. Frederico Hamman T. Hanan Moen, MD, [facility] [place], [address] Phone: [phone] Fax: [phone]  08/21/2014  Patient: Angelica Rivers, MRN: [id number], DOB: [birth year], 68 y.o.  Primary Physician:  Loura Pardon, MD  Chief Complaint: Knee Pain  Subjective:   Angelica Rivers is a 68 y.o. very pleasant female patient who presents with the following:  R knee pain, no trauma or injury. Trama and fell and hit the left knee - 5 years ago. No new activities. Dr. Glori Bickers asked me to evaluate weight the patient for right-sided knee pain.  She has not had any type of injury at all, and she has not had any effusion.  She has not had any bruising whatsoever.  She does have some knee pain when she is active.  She has been taking some Aleve, 2 tablets at nighttime over the last week, and this is helped significantly on her right-sided knee pain.  She has not tried any other pharmacotherapy or braces.  She also has tried icing her knee a little bit, and that seemed to help a little bit additionally.  I reviewed the patient's four-way non-weightbearing knee films myself and with the patient face-to-face in the office.  She does have a mild amount of mostly medial compartment osteoarthritis.  There is a moderate size osteophyte on the medial aspect of the tibial plateau, and there is some beaking of her tibial spines.  Overall, mild tricompartmental arthritis.  Put an ice pack on it some time. Not that long.   Past Medical History, Surgical History, Social History, Family History, Problem List, Medications, and Allergies have been reviewed and updated if relevant.  GEN: No fevers, chills. Nontoxic. Primarily MSK c/o today. MSK: Detailed in the HPI GI: tolerating PO intake without difficulty Neuro: No numbness, parasthesias, or tingling associated. Otherwise the pertinent positives of the ROS are noted above.   Objective:   BP 120/68  mmHg  Pulse 100  Temp(Src) 98.4 F (36.9 C) (Oral)  Ht 5\' 5"  (1.651 m)  Wt 136 lb (61.689 kg)  BMI 22.63 kg/m2   GEN: WDWN, NAD, Non-toxic, Alert & Oriented x 3 HEENT: Atraumatic, Normocephalic.  Ears and Nose: No external deformity. EXTR: No clubbing/cyanosis/edema NEURO: Normal gait.  PSYCH: Normally interactive. Conversant. Not depressed or anxious appearing.  Calm demeanor.   Knee:  R Gait: Normal heel toe pattern ROM: 0-125 Effusion: neg Echymosis or edema: none Patellar tendon NT Painful PLICA: neg Patellar grind: negative Medial and lateral patellar facet loading: negative medial and lateral joint lines:NT Mcmurray's neg Flexion-pinch neg Varus and valgus stress: stable Lachman: neg Ant and Post drawer: neg Hip abduction, IR, ER: WNL Hip flexion str: 5/5 Hip abd: 5/5 Quad: 5/5 VMO atrophy:No Hamstring concentric and eccentric: 5/5   Radiology: Dg Knee Complete 4 Views Right  08/13/2014   CLINICAL DATA:  Right knee pain; no mention of trauma  EXAM: RIGHT KNEE - COMPLETE 4+ VIEW  COMPARISON:  None.  FINDINGS: The bones of the knee are adequately mineralized. There is beaking of the tibial spines. There are small spurs from the periphery of the articular surfaces of the medial femoral condyle and medial tibial plateau. The joint spaces are reasonably well maintained. There is no acute fracture. The soft tissues are unremarkable.  IMPRESSION: There are mild degenerative changes of the right knee. No acute bony abnormality is demonstrated.   Electronically Signed   By: David  Martinique  On: 08/13/2014 11:48    Assessment and Plan:   Primary osteoarthritis of right knee  Osteoarthritis flare which appears to be already improving somewhat with oral anti-inflammatories.  She can take Aleve 2 tablets by mouth twice a day when needed in addition to oral Tylenol.  I also gave her some tramadol to use when necessary for pain when needed.  Currently, no effusion, and her  knee is not all that symptomatic.  Recommended continued activity, good weight control, and quadriceps strengthening.  She can follow up with me on an as-needed basis.  Follow-up: prn  New Prescriptions   TRAMADOL (ULTRAM) 50 MG TABLET    Take 1 tablet (50 mg total) by mouth every 6 (six) hours as needed.   No orders of the defined types were placed in this encounter.    Signed,  Maud Deed. Lataunya Ruud, MD   Patient's Medications  New Prescriptions   TRAMADOL (ULTRAM) 50 MG TABLET    Take 1 tablet (50 mg total) by mouth every 6 (six) hours as needed.  Previous Medications   ASPIRIN 325 MG TABLET    Take 325 mg by mouth every other day.   CHOLECALCIFEROL (VITAMIN D) 2000 UNITS CAPS    Take 2 capsules by mouth daily.   FLAXSEED, LINSEED, (FLAX SEED OIL) 1000 MG CAPS    Take 1 capsule by mouth daily.   Modified Medications   No medications on file  Discontinued Medications   No medications on file

## 2015-03-12 ENCOUNTER — Encounter: Payer: Self-pay | Admitting: Gastroenterology

## 2015-03-28 ENCOUNTER — Ambulatory Visit (INDEPENDENT_AMBULATORY_CARE_PROVIDER_SITE_OTHER): Payer: Medicare Other | Admitting: Family Medicine

## 2015-03-28 ENCOUNTER — Ambulatory Visit (INDEPENDENT_AMBULATORY_CARE_PROVIDER_SITE_OTHER)
Admission: RE | Admit: 2015-03-28 | Discharge: 2015-03-28 | Disposition: A | Discharge status: Home / Self Care | Payer: Medicare Other | Source: Ambulatory Visit | Attending: Family Medicine | Admitting: Family Medicine

## 2015-03-28 ENCOUNTER — Telehealth: Payer: Self-pay | Admitting: Family Medicine

## 2015-03-28 ENCOUNTER — Encounter: Payer: Self-pay | Admitting: Family Medicine

## 2015-03-28 VITALS — BP 122/68 | HR 91 | Temp 98.0°F | Ht 65.0 in | Wt 133.8 lb

## 2015-03-28 DIAGNOSIS — S62646A Nondisplaced fracture of proximal phalanx of right little finger, initial encounter for closed fracture: Secondary | ICD-10-CM | POA: Insufficient documentation

## 2015-03-28 DIAGNOSIS — M79641 Pain in right hand: Secondary | ICD-10-CM

## 2015-03-28 DIAGNOSIS — S62616A Displaced fracture of proximal phalanx of right little finger, initial encounter for closed fracture: Secondary | ICD-10-CM | POA: Diagnosis not present

## 2015-03-28 NOTE — Telephone Encounter (Signed)
Ref to ortho for finger fracture Will route to Rosaria Ferries to see on Monday

## 2015-03-28 NOTE — Progress Notes (Signed)
Pre visit review using our clinic review tool, if applicable. No additional management support is needed unless otherwise documented below in the visit note. 

## 2015-03-28 NOTE — Telephone Encounter (Signed)
Please see ortho ref for this pt-thanks

## 2015-03-28 NOTE — Patient Instructions (Signed)
Xray of hand today  We will contact you with a result  Use ice pack  Try not to overuse that finger  You can tape it to your ring finger if that makes it feel better as well  Aleve is ok for pain if you need it

## 2015-03-28 NOTE — Progress Notes (Signed)
Subjective:    Patient ID: Angelica Rivers, female    DOB: 31-Mar-1946, 69 y.o.   MRN: [id number]  HPI Here with finger injury (just over a week ago)   Hurt herself when putting towels up and was pushed into the door by her dog  R pinky finger (is R handed) A little sore-not bad The next day her hand and finger were swollen with bruising  She can make a fist but 5th finger only goes down part way- (hurts to fully flex)   Patient Active Problem List   Diagnosis Date Noted  . Right knee pain 08/13/2014  . Cellulitis of left foot 03/08/2014  . Colon cancer screening 12/17/2013  . Encounter for Medicare annual wellness exam 12/08/2013  . Routine general medical examination at a health care facility 12/16/2012  . Gynecological examination 10/13/2011  . UNSPECIFIED VITAMIN D DEFICIENCY 09/23/2009  . THROMBOCYTOPENIA 09/23/2009  . HYPERLIPIDEMIA 09/12/2008  . FIBROCYSTIC BREAST DISEASE 09/08/2007  . Osteopenia 09/08/2007  . NEPHROLITHIASIS, HX OF 09/08/2007   Past Medical History  Diagnosis Date  . Osteopenia   . Anemia   . Nephrolithiasis     Hx of  . Osteoporosis     fosamax for over 5 yrs.  . Vaginal cyst     stable  . History of repair of mitral valve     proph for dentist   Past Surgical History  Procedure Laterality Date  . Cardiac surgery  1995    mitral valve repair carpenters ring  . Lithotripsy for kidney stones    . Tonsillectomy    . Ruptured ovarian cyst surgery    . Hemorrhoid surgery    . Mitral valve replacement  07/2003  . Skin graft dog bite  2010   History  Substance Use Topics  . Smoking status: Never Smoker   . Smokeless tobacco: Never Used  . Alcohol Use: No   Family History  Problem Relation Age of Onset  . Colon cancer Mother   . Diabetes Brother    No Known Allergies Current Outpatient Prescriptions on File Prior to Visit  Medication Sig Dispense Refill  . aspirin 325 MG tablet Take 325 mg by mouth every other day.    .  Cholecalciferol (VITAMIN D) 2000 UNITS CAPS Take 2 capsules by mouth daily.    . Flaxseed, Linseed, (FLAX SEED OIL) 1000 MG CAPS Take 1 capsule by mouth daily.     . traMADol (ULTRAM) 50 MG tablet Take 1 tablet (50 mg total) by mouth every 6 (six) hours as needed. 40 tablet 2   No current facility-administered medications on file prior to visit.        Review of Systems Review of Systems  Constitutional: Negative for fever, appetite change, fatigue and unexpected weight change.  Eyes: Negative for pain and visual disturbance.  Respiratory: Negative for cough and shortness of breath.   Cardiovascular: Negative for cp or palpitations    Gastrointestinal: Negative for nausea, diarrhea and constipation.  Genitourinary: Negative for urgency and frequency.  Skin: Negative for pallor or rash  MSK pos for R 5th finger pain and swelling   Neurological: Negative for weakness, light-headedness, numbness and headaches.  Hematological: Negative for adenopathy. Does not bruise/bleed easily.  Psychiatric/Behavioral: Negative for dysphoric mood. The patient is not nervous/anxious.         Objective:   Physical Exam  Constitutional: She appears well-developed and well-nourished.  Neck: Normal range of motion. Neck supple.  Cardiovascular: Normal rate,  regular rhythm and intact distal pulses.   Pulmonary/Chest: Effort normal and breath sounds normal.  Musculoskeletal: She exhibits edema and tenderness.  R fifth finger swelling/tenderness and ecchymosis over proximal phalanx and also medial hand (no metacarpal tenderness)  Pain to fully flex and extend finger  Nl perf and sens   Lymphadenopathy:    She has no cervical adenopathy.  Neurological: She is alert. She has normal strength and normal reflexes. She displays no atrophy. No sensory deficit. She exhibits normal muscle tone.  Skin: Skin is warm and dry. No rash noted. No erythema. No pallor.  Psychiatric: She has a normal mood and affect.           Assessment & Plan:   Problem List Items Addressed This Visit    Right hand pain - Primary    From injury (overextension) over a week ago  Pain/swelling/tenderness and ecchymosis over proximal 5th phalanx -with pain to fully flex  xr today - pending reading  Adv ice/nsaid/relative rest and buddy taping to 4th finger        Relevant Orders   DG Hand Complete Right (Completed)

## 2015-03-30 NOTE — Assessment & Plan Note (Signed)
From injury (overextension) over a week ago  Pain/swelling/tenderness and ecchymosis over proximal 5th phalanx -with pain to fully flex  xr today - pending reading  Adv ice/nsaid/relative rest and buddy taping to 4th finger

## 2015-03-31 DIAGNOSIS — S6991XA Unspecified injury of right wrist, hand and finger(s), initial encounter: Secondary | ICD-10-CM | POA: Diagnosis not present

## 2015-03-31 NOTE — Telephone Encounter (Signed)
Pt scheduled to see Dr. French Ana today 03/31/15 at 330p

## 2015-04-15 DIAGNOSIS — S6991XD Unspecified injury of right wrist, hand and finger(s), subsequent encounter: Secondary | ICD-10-CM | POA: Diagnosis not present

## 2015-07-29 DIAGNOSIS — N281 Cyst of kidney, acquired: Secondary | ICD-10-CM | POA: Diagnosis not present

## 2015-07-29 DIAGNOSIS — N200 Calculus of kidney: Secondary | ICD-10-CM | POA: Diagnosis not present

## 2015-07-31 DIAGNOSIS — Z1231 Encounter for screening mammogram for malignant neoplasm of breast: Secondary | ICD-10-CM | POA: Diagnosis not present

## 2015-07-31 LAB — HM MAMMOGRAPHY: HM MAMMO: NORMAL

## 2015-08-01 ENCOUNTER — Encounter: Payer: Self-pay | Admitting: Family Medicine

## 2015-08-04 ENCOUNTER — Encounter: Payer: Self-pay | Admitting: *Deleted

## 2015-08-04 ENCOUNTER — Encounter: Payer: Self-pay | Admitting: Family Medicine

## 2015-08-13 DIAGNOSIS — N281 Cyst of kidney, acquired: Secondary | ICD-10-CM | POA: Diagnosis not present

## 2015-08-13 DIAGNOSIS — N200 Calculus of kidney: Secondary | ICD-10-CM | POA: Diagnosis not present

## 2015-08-29 ENCOUNTER — Other Ambulatory Visit: Payer: Self-pay | Admitting: *Deleted

## 2015-08-29 MED ORDER — AMOXICILLIN 500 MG PO CAPS: ORAL_CAPSULE | ORAL | Status: DC

## 2015-08-29 NOTE — Telephone Encounter (Signed)
done

## 2015-08-29 NOTE — Telephone Encounter (Signed)
Fax refill request, pt has CPE scheduled on 09/15/15, please advise

## 2015-08-29 NOTE — Telephone Encounter (Signed)
Please refill times 5  Thanks

## 2015-09-07 ENCOUNTER — Telehealth: Payer: Self-pay | Admitting: Family Medicine

## 2015-09-07 DIAGNOSIS — E559 Vitamin D deficiency, unspecified: Secondary | ICD-10-CM

## 2015-09-07 DIAGNOSIS — E785 Hyperlipidemia, unspecified: Secondary | ICD-10-CM

## 2015-09-07 DIAGNOSIS — Z0000 Encounter for general adult medical examination without abnormal findings: Secondary | ICD-10-CM

## 2015-09-07 DIAGNOSIS — D696 Thrombocytopenia, unspecified: Secondary | ICD-10-CM

## 2015-09-07 NOTE — Telephone Encounter (Signed)
-----   Message from Marchia Bond sent at 09/02/2015  9:43 AM EST ----- Regarding: cpx labs Mon 1/9, need orders. Thanks! :-) Please order  future cpx labs for pt's upcoming lab appt. Thanks Aniceto Boss

## 2015-09-08 ENCOUNTER — Other Ambulatory Visit: Payer: Self-pay

## 2015-09-12 ENCOUNTER — Other Ambulatory Visit (INDEPENDENT_AMBULATORY_CARE_PROVIDER_SITE_OTHER): Payer: Medicare Other

## 2015-09-12 DIAGNOSIS — E559 Vitamin D deficiency, unspecified: Secondary | ICD-10-CM

## 2015-09-12 DIAGNOSIS — E785 Hyperlipidemia, unspecified: Secondary | ICD-10-CM

## 2015-09-12 DIAGNOSIS — Z0000 Encounter for general adult medical examination without abnormal findings: Secondary | ICD-10-CM | POA: Diagnosis not present

## 2015-09-12 DIAGNOSIS — D696 Thrombocytopenia, unspecified: Secondary | ICD-10-CM | POA: Diagnosis not present

## 2015-09-12 LAB — COMPREHENSIVE METABOLIC PANEL
ALBUMIN: 4.1 g/dL (ref 3.5–5.2)
ALT: 15 U/L (ref 0–35)
AST: 19 U/L (ref 0–37)
Alkaline Phosphatase: 48 U/L (ref 39–117)
BILIRUBIN TOTAL: 0.3 mg/dL (ref 0.2–1.2)
BUN: 16 mg/dL (ref 6–23)
CALCIUM: 9.6 mg/dL (ref 8.4–10.5)
CHLORIDE: 108 meq/L (ref 96–112)
CO2: 29 mEq/L (ref 19–32)
Creatinine, Ser: 0.95 mg/dL (ref 0.40–1.20)
GFR: 61.92 mL/min (ref >60.00)
Glucose, Bld: 85 mg/dL (ref 70–99)
Potassium: 4.6 mEq/L (ref 3.5–5.1)
SODIUM: 142 meq/L (ref 135–145)
Total Protein: 7 g/dL (ref 6.0–8.3)

## 2015-09-12 LAB — CBC WITH DIFFERENTIAL/PLATELET
BASOS ABS: 0 10*3/uL (ref 0.0–0.1)
Basophils Relative: 0.3 % (ref 0.0–3.0)
EOS ABS: 0.2 10*3/uL (ref 0.0–0.7)
Eosinophils Relative: 3.5 % (ref 0.0–5.0)
HEMATOCRIT: 39.2 % (ref 36.0–46.0)
HEMOGLOBIN: 12.8 g/dL (ref 12.0–15.0)
LYMPHS PCT: 32 % (ref 12.0–46.0)
Lymphs Abs: 1.6 10*3/uL (ref 0.7–4.0)
MCHC: 32.5 g/dL (ref 30.0–36.0)
MCV: 88.4 fl (ref 78.0–100.0)
MONO ABS: 0.4 10*3/uL (ref 0.1–1.0)
Monocytes Relative: 8.1 % (ref 3.0–12.0)
Neutro Abs: 2.8 10*3/uL (ref 1.4–7.7)
Neutrophils Relative %: 56.1 % (ref 43.0–77.0)
Platelets: 144 10*3/uL — ABNORMAL LOW (ref 150.0–400.0)
RBC: 4.44 Mil/uL (ref 3.87–5.11)
RDW: 14 % (ref 11.5–15.5)
WBC: 5 10*3/uL (ref 4.0–10.5)

## 2015-09-12 LAB — LIPID PANEL
CHOL/HDL RATIO: 3
CHOLESTEROL: 185 mg/dL (ref 0–200)
HDL: 62 mg/dL (ref >39.00)
LDL CALC: 110 mg/dL — AB (ref 0–99)
NonHDL: 122.85
TRIGLYCERIDES: 66 mg/dL (ref 0.0–149.0)
VLDL: 13.2 mg/dL (ref 0.0–40.0)

## 2015-09-12 LAB — VITAMIN D 25 HYDROXY (VIT D DEFICIENCY, FRACTURES): VITD: 44.83 ng/mL (ref 30.00–100.00)

## 2015-09-12 LAB — TSH: TSH: 1.12 u[IU]/mL (ref 0.35–4.50)

## 2015-09-15 ENCOUNTER — Encounter: Payer: Self-pay | Admitting: Family Medicine

## 2015-09-15 ENCOUNTER — Ambulatory Visit (INDEPENDENT_AMBULATORY_CARE_PROVIDER_SITE_OTHER): Payer: Medicare Other | Admitting: Family Medicine

## 2015-09-15 VITALS — BP 128/76 | HR 85 | Temp 97.9°F | Ht 62.0 in | Wt 126.0 lb

## 2015-09-15 DIAGNOSIS — M8580 Other specified disorders of bone density and structure, unspecified site: Secondary | ICD-10-CM | POA: Diagnosis not present

## 2015-09-15 DIAGNOSIS — E785 Hyperlipidemia, unspecified: Secondary | ICD-10-CM | POA: Diagnosis not present

## 2015-09-15 DIAGNOSIS — E559 Vitamin D deficiency, unspecified: Secondary | ICD-10-CM | POA: Diagnosis not present

## 2015-09-15 DIAGNOSIS — E2839 Other primary ovarian failure: Secondary | ICD-10-CM

## 2015-09-15 DIAGNOSIS — Z0000 Encounter for general adult medical examination without abnormal findings: Secondary | ICD-10-CM

## 2015-09-15 DIAGNOSIS — D696 Thrombocytopenia, unspecified: Secondary | ICD-10-CM

## 2015-09-15 NOTE — Assessment & Plan Note (Signed)
Disc goals for lipids and reasons to control them Rev labs with pt Rev low sat fat diet in detail   

## 2015-09-15 NOTE — Patient Instructions (Signed)
Work on an Forensic scientist (the blue booklet I gave you)  Take care of yourself  Labs are stable  Stop at check out for referral for dexa

## 2015-09-15 NOTE — Assessment & Plan Note (Signed)
Ref for f/u dexa -post menopausal

## 2015-09-15 NOTE — Assessment & Plan Note (Signed)
Reviewed health habits including diet and exercise and skin cancer prevention Reviewed appropriate screening tests for age  Also reviewed health mt list, fam hx and immunization status , as well as social and family history   See HPI Labs reviewed Work on an Forensic scientist (the blue booklet I gave you)  Take care of yourself  Labs are stable  Stop at check out for referral for dexa

## 2015-09-15 NOTE — Progress Notes (Signed)
Subjective:    Patient ID: Angelica Rivers, female    DOB: [birth year], 70 y.o.   MRN: [id number]  HPI Here for annual medicare wellness visit as well as chronic/acute medical problems and also annual preventative exam  I have personally reviewed the Medicare Annual Wellness questionnaire and have noted 1. The patient's medical and social history 2. Their use of alcohol, tobacco or illicit drugs 3. Their current medications and supplements 4. The patient's functional ability including ADL's, fall risks, home safety risks and hearing or visual             impairment. 5. Diet and physical activities 6. Evidence for depression or mood disorders  The patients weight, height, BMI have been recorded in the chart and visual acuity is per eye clinic.  I have made referrals, counseling and provided education to the patient based review of the above and I have provided the pt with a written personalized care plan for preventive services. Reviewed and updated provider list, see scanned forms.  See scanned forms.  Routine anticipatory guidance given to patient.  See health maintenance. Colon cancer screening 2/10 - nl / 5 year recall due to family history Hep C -not high risk /declines  Breast cancer screening 12/16 ok  Self breast exam no lumps or changes  Flu vaccine 10/16 at pharmacy Tetanus vaccine 7/15  Pneumovax complete on both Zoster vaccine 5/14  dexa - 6/14 - mild osteopenia, had fosamax in the past  Due for dexa at [facility] Had a finger fracture R 5th after trauma-is getting better  Taking ca and D , D level is 44.8  Advance directive- does not have a living will and POA - is interested in this  Cognitive function addressed- see scanned forms- and if abnormal then additional documentation follows. Memory is fine / no problems or complaints   PMH and SH reviewed  Meds, vitals, and allergies reviewed.   ROS: See HPI.  Otherwise negative.    Wt is down 7 lb with bmi of 23 She  walks every day and has been very busy and on the go  Looking after mother at 89, and others  Down 11 lb from 8/15 Eats regular meals - but she may eat a little less because she has been drinking water more as well  Appetite fine and not feeling sick  Does not skip meals   Hx of thrombocytopenia Labs are stable Lab Results  Component Value Date   WBC 5.0 09/12/2015   HGB 12.8 09/12/2015   HCT 39.2 09/12/2015   MCV 88.4 09/12/2015   PLT 144.0* 09/12/2015    Results for orders placed or performed in visit on 09/12/15  CBC with Differential/Platelet  Result Value Ref Range   WBC 5.0 4.0 - 10.5 K/uL   RBC 4.44 3.87 - 5.11 Mil/uL   Hemoglobin 12.8 12.0 - 15.0 g/dL   HCT 39.2 36.0 - 46.0 %   MCV 88.4 78.0 - 100.0 fl   MCHC 32.5 30.0 - 36.0 g/dL   RDW 14.0 11.5 - 15.5 %   Platelets 144.0 (L) 150.0 - 400.0 K/uL   Neutrophils Relative % 56.1 43.0 - 77.0 %   Lymphocytes Relative 32.0 12.0 - 46.0 %   Monocytes Relative 8.1 3.0 - 12.0 %   Eosinophils Relative 3.5 0.0 - 5.0 %   Basophils Relative 0.3 0.0 - 3.0 %   Neutro Abs 2.8 1.4 - 7.7 K/uL   Lymphs Abs 1.6 0.7 - 4.0 K/uL  Monocytes Absolute 0.4 0.1 - 1.0 K/uL   Eosinophils Absolute 0.2 0.0 - 0.7 K/uL   Basophils Absolute 0.0 0.0 - 0.1 K/uL  Comprehensive metabolic panel  Result Value Ref Range   Sodium 142 135 - 145 mEq/L   Potassium 4.6 3.5 - 5.1 mEq/L   Chloride 108 96 - 112 mEq/L   CO2 29 19 - 32 mEq/L   Glucose, Bld 85 70 - 99 mg/dL   BUN 16 6 - 23 mg/dL   Creatinine, Ser 0.95 0.40 - 1.20 mg/dL   Total Bilirubin 0.3 0.2 - 1.2 mg/dL   Alkaline Phosphatase 48 39 - 117 U/L   AST 19 0 - 37 U/L   ALT 15 0 - 35 U/L   Total Protein 7.0 6.0 - 8.3 g/dL   Albumin 4.1 3.5 - 5.2 g/dL   Calcium 9.6 8.4 - 10.5 mg/dL   GFR 61.92 >60.00 mL/min  Lipid panel  Result Value Ref Range   Cholesterol 185 0 - 200 mg/dL   Triglycerides 66.0 0.0 - 149.0 mg/dL   HDL 62.00 >39.00 mg/dL   VLDL 13.2 0.0 - 40.0 mg/dL   LDL Cholesterol 110  (H) 0 - 99 mg/dL   Total CHOL/HDL Ratio 3    NonHDL 122.85   TSH  Result Value Ref Range   TSH 1.12 0.35 - 4.50 uIU/mL  VITAMIN D 25 Hydroxy (Vit-D Deficiency, Fractures)  Result Value Ref Range   VITD 44.83 30.00 - 100.00 ng/mL    Overall eats pretty well occ candy -does not overdue it   No gyn problems   Patient Active Problem List   Diagnosis Date Noted  . Estrogen deficiency 09/15/2015  . Right hand pain 03/28/2015  . Closed nondisplaced fracture of proximal phalanx of right little finger 03/28/2015  . Right knee pain 08/13/2014  . Cellulitis of left foot 03/08/2014  . Colon cancer screening 12/17/2013  . Encounter for Medicare annual wellness exam 12/08/2013  . Routine general medical examination at a health care facility 12/16/2012  . Gynecological examination 10/13/2011  . Vitamin D deficiency 09/23/2009  . THROMBOCYTOPENIA 09/23/2009  . Hyperlipidemia 09/12/2008  . FIBROCYSTIC BREAST DISEASE 09/08/2007  . Osteopenia 09/08/2007  . NEPHROLITHIASIS, HX OF 09/08/2007   Past Medical History  Diagnosis Date  . Osteopenia   . Anemia   . Nephrolithiasis     Hx of  . Osteoporosis     fosamax for over 5 yrs.  . Vaginal cyst     stable  . History of repair of mitral valve     proph for dentist   Past Surgical History  Procedure Laterality Date  . Cardiac surgery  1995    mitral valve repair carpenters ring  . Lithotripsy for kidney stones    . Tonsillectomy    . Ruptured ovarian cyst surgery    . Hemorrhoid surgery    . Mitral valve replacement  07/2003  . Skin graft dog bite  2010   Social History  Substance Use Topics  . Smoking status: Never Smoker   . Smokeless tobacco: Never Used  . Alcohol Use: No   Family History  Problem Relation Age of Onset  . Colon cancer Mother   . Diabetes Brother    Allergies  Allergen Reactions  . Neosporin [Neomycin-Bacitracin Zn-Polymyx]    Current Outpatient Prescriptions on File Prior to Visit  Medication Sig  Dispense Refill  . amoxicillin (AMOXIL) 500 MG capsule TAKE 4 CAPSULES BY MOUTH BEFORE DENTAL APPOINTMENT 4 capsule  4  . aspirin 325 MG tablet Take 325 mg by mouth every other day.    . Cholecalciferol (VITAMIN D) 2000 UNITS CAPS Take 2 capsules by mouth daily.    . Flaxseed, Linseed, (FLAX SEED OIL) 1000 MG CAPS Take 1 capsule by mouth daily.      No current facility-administered medications on file prior to visit.    Review of Systems Review of Systems  Constitutional: Negative for fever, appetite change, fatigue and unexpected weight change.  Eyes: Negative for pain and visual disturbance.  Respiratory: Negative for cough and shortness of breath.   Cardiovascular: Negative for cp or palpitations    Gastrointestinal: Negative for nausea, diarrhea and constipation.  Genitourinary: Negative for urgency and frequency.  Skin: Negative for pallor or rash   Neurological: Negative for weakness, light-headedness, numbness and headaches.  Hematological: Negative for adenopathy. Does not bruise/bleed easily.  Psychiatric/Behavioral: Negative for dysphoric mood. The patient is not nervous/anxious.         Objective:   Physical Exam  Constitutional: She appears well-developed and well-nourished. No distress.  obese and well appearing   HENT:  Head: Normocephalic and atraumatic.  Right Ear: External ear normal.  Left Ear: External ear normal.  Mouth/Throat: Oropharynx is clear and moist.  Eyes: Conjunctivae and EOM are normal. Pupils are equal, round, and reactive to light. No scleral icterus.  Neck: Normal range of motion. Neck supple. No JVD present. Carotid bruit is not present. No thyromegaly present.  Cardiovascular: Normal rate, regular rhythm, normal heart sounds and intact distal pulses.  Exam reveals no gallop.   Pulmonary/Chest: Effort normal and breath sounds normal. No respiratory distress. She has no wheezes. She exhibits no tenderness.  Abdominal: Soft. Bowel sounds are normal.  She exhibits no distension, no abdominal bruit and no mass. There is no tenderness.  Genitourinary: No breast swelling, tenderness, discharge or bleeding.  Breast exam: No mass, nodules, thickening, tenderness, bulging, retraction, inflamation, nipple discharge or skin changes noted.  No axillary or clavicular LA.      Musculoskeletal: Normal range of motion. She exhibits no edema or tenderness.  No kyphosis   Lymphadenopathy:    She has no cervical adenopathy.  Neurological: She is alert. She has normal reflexes. No cranial nerve deficit. She exhibits normal muscle tone. Coordination normal.  Skin: Skin is warm and dry. No rash noted. No erythema. No pallor.  Psychiatric: She has a normal mood and affect.          Assessment & Plan:   Problem List Items Addressed This Visit      Musculoskeletal and Integument   Osteopenia    Disc need for calcium/ vitamin D/ wt bearing exercise and bone density test every 2 y to monitor Disc safety/ fracture risk in detail   Schedule f/u dexa Had a finger fx this year         Other   Encounter for Medicare annual wellness exam - Primary    Reviewed health habits including diet and exercise and skin cancer prevention Reviewed appropriate screening tests for age  Also reviewed health mt list, fam hx and immunization status , as well as social and family history   See HPI Labs reviewed Work on an Forensic scientist (the blue booklet I gave you)  Take care of yourself  Labs are stable  Stop at check out for referral for dexa       Estrogen deficiency    Ref for f/u dexa -post menopausal  Relevant Orders   DG Bone Density   Hyperlipidemia    Disc goals for lipids and reasons to control them Rev labs with pt Rev low sat fat diet in detail        Routine general medical examination at a health care facility    Reviewed health habits including diet and exercise and skin cancer prevention Reviewed appropriate screening tests for  age  Also reviewed health mt list, fam hx and immunization status , as well as social and family history   See HPI Labs reviewed Work on an Forensic scientist (the blue booklet I gave you)  Take care of yourself  Labs are stable  Stop at check out for referral for dexa       THROMBOCYTOPENIA    Mild and stable with ct in 140s  No c/o  Continue to watch      Vitamin D deficiency    Vitamin D level is therapeutic with current supplementation Disc importance of this to bone and overall health

## 2015-09-15 NOTE — Assessment & Plan Note (Signed)
Mild and stable with ct in 140s  No c/o  Continue to watch

## 2015-09-15 NOTE — Progress Notes (Signed)
Pre visit review using our clinic review tool, if applicable. No additional management support is needed unless otherwise documented below in the visit note. 

## 2015-09-15 NOTE — Assessment & Plan Note (Signed)
Disc need for calcium/ vitamin D/ wt bearing exercise and bone density test every 2 y to monitor Disc safety/ fracture risk in detail   Schedule f/u dexa Had a finger fx this year

## 2015-09-15 NOTE — Assessment & Plan Note (Signed)
Vitamin D level is therapeutic with current supplementation Disc importance of this to bone and overall health  

## 2015-09-16 DIAGNOSIS — M8589 Other specified disorders of bone density and structure, multiple sites: Secondary | ICD-10-CM | POA: Diagnosis not present

## 2015-09-16 LAB — HM DEXA SCAN

## 2015-09-23 ENCOUNTER — Encounter: Payer: Self-pay | Admitting: Family Medicine

## 2015-09-29 ENCOUNTER — Encounter: Payer: Self-pay | Admitting: Family Medicine

## 2015-09-29 ENCOUNTER — Encounter: Payer: Self-pay | Admitting: *Deleted

## 2016-01-24 ENCOUNTER — Emergency Department (HOSPITAL_COMMUNITY)
Admission: EM | Admit: 2016-01-24 | Discharge: 2016-01-25 | Disposition: A | Discharge status: Home / Self Care | Payer: Medicare Other | Attending: Emergency Medicine | Admitting: Emergency Medicine

## 2016-01-24 ENCOUNTER — Encounter (HOSPITAL_COMMUNITY): Payer: Self-pay | Admitting: Emergency Medicine

## 2016-01-24 ENCOUNTER — Emergency Department (HOSPITAL_COMMUNITY): Payer: Medicare Other

## 2016-01-24 DIAGNOSIS — N200 Calculus of kidney: Secondary | ICD-10-CM | POA: Insufficient documentation

## 2016-01-24 DIAGNOSIS — Z7982 Long term (current) use of aspirin: Secondary | ICD-10-CM | POA: Insufficient documentation

## 2016-01-24 DIAGNOSIS — M810 Age-related osteoporosis without current pathological fracture: Secondary | ICD-10-CM | POA: Diagnosis not present

## 2016-01-24 DIAGNOSIS — N132 Hydronephrosis with renal and ureteral calculous obstruction: Secondary | ICD-10-CM | POA: Diagnosis not present

## 2016-01-24 DIAGNOSIS — R109 Unspecified abdominal pain: Secondary | ICD-10-CM | POA: Diagnosis not present

## 2016-01-24 LAB — COMPREHENSIVE METABOLIC PANEL
ALBUMIN: 4.8 g/dL (ref 3.5–5.0)
ALT: 20 U/L (ref 14–54)
ANION GAP: 7 (ref 5–15)
AST: 28 U/L (ref 15–41)
Alkaline Phosphatase: 47 U/L (ref 38–126)
BUN: 22 mg/dL — ABNORMAL HIGH (ref 6–20)
CO2: 24 mmol/L (ref 22–32)
Calcium: 9.3 mg/dL (ref 8.9–10.3)
Chloride: 107 mmol/L (ref 101–111)
Creatinine, Ser: 1.05 mg/dL — ABNORMAL HIGH (ref 0.44–1.00)
GFR calc Af Amer: >60 mL/min (ref >60)
GFR calc non Af Amer: 53 mL/min — ABNORMAL LOW (ref >60)
Glucose, Bld: 95 mg/dL (ref 65–99)
POTASSIUM: 4.3 mmol/L (ref 3.5–5.1)
SODIUM: 138 mmol/L (ref 135–145)
Total Bilirubin: 1 mg/dL (ref 0.3–1.2)
Total Protein: 7.8 g/dL (ref 6.5–8.1)

## 2016-01-24 LAB — URINALYSIS, ROUTINE W REFLEX MICROSCOPIC
GLUCOSE, UA: NEGATIVE mg/dL
Ketones, ur: 15 mg/dL — AB
Leukocytes, UA: NEGATIVE
Nitrite: NEGATIVE
Protein, ur: NEGATIVE mg/dL
SPECIFIC GRAVITY, URINE: 1.025 (ref 1.005–1.030)
pH: 6 (ref 5.0–8.0)

## 2016-01-24 LAB — CBC WITH DIFFERENTIAL/PLATELET
BASOS ABS: 0 10*3/uL (ref 0.0–0.1)
Basophils Relative: 0 %
Eosinophils Absolute: 0.1 10*3/uL (ref 0.0–0.7)
Eosinophils Relative: 1 %
HCT: 40.5 % (ref 36.0–46.0)
Hemoglobin: 13.4 g/dL (ref 12.0–15.0)
LYMPHS PCT: 18 %
Lymphs Abs: 1.3 10*3/uL (ref 0.7–4.0)
MCH: 28.7 pg (ref 26.0–34.0)
MCHC: 33.1 g/dL (ref 30.0–36.0)
MCV: 86.7 fL (ref 78.0–100.0)
Monocytes Absolute: 0.4 10*3/uL (ref 0.1–1.0)
Monocytes Relative: 6 %
NEUTROS ABS: 5.7 10*3/uL (ref 1.7–7.7)
Neutrophils Relative %: 76 %
Platelets: UNDETERMINED 10*3/uL (ref 150–400)
RBC: 4.67 MIL/uL (ref 3.87–5.11)
RDW: 13.2 % (ref 11.5–15.5)
WBC: 7.5 10*3/uL (ref 4.0–10.5)

## 2016-01-24 LAB — URINE MICROSCOPIC-ADD ON: Bacteria, UA: NONE SEEN

## 2016-01-24 LAB — LIPASE, BLOOD: Lipase: 24 U/L (ref 11–51)

## 2016-01-24 MED ORDER — OXYCODONE-ACETAMINOPHEN 5-325 MG PO TABS: 1.0000 | ORAL_TABLET | ORAL | Status: DC | PRN

## 2016-01-24 MED ORDER — ONDANSETRON HCL 4 MG/2ML IJ SOLN
4.0000 mg | Freq: Once | INTRAMUSCULAR | Status: AC
  Administered 2016-01-24: 4 mg via INTRAVENOUS
  Filled 2016-01-24: qty 2

## 2016-01-24 MED ORDER — SODIUM CHLORIDE 0.9 % IV BOLUS (SEPSIS)
1000.0000 mL | Freq: Once | INTRAVENOUS | Status: AC
  Administered 2016-01-24: 1000 mL via INTRAVENOUS

## 2016-01-24 MED ORDER — KETOROLAC TROMETHAMINE 30 MG/ML IJ SOLN
30.0000 mg | Freq: Once | INTRAMUSCULAR | Status: AC
  Administered 2016-01-24: 30 mg via INTRAVENOUS
  Filled 2016-01-24: qty 1

## 2016-01-24 NOTE — ED Notes (Signed)
Patient presents for right flank pain starting earlier today, also c/o urinary frequency. History of kidney stones. Denies dysuria, N/V, fever. Rates pain 10/10.

## 2016-01-24 NOTE — ED Provider Notes (Signed)
CSN: [id number]     Arrival date & time 01/24/16  2044 History   First MD Initiated Contact with Patient 01/24/16 2211     Chief Complaint  Patient presents with  . Flank Pain     (Consider location/radiation/quality/duration/timing/severity/associated sxs/prior Treatment) HPI   Patient is a 70 year old female with past medical history of kidney stones and anemia who presents to the ED with complaint of right flank pain, onset 7 PM. Patient reports having waxing and waning sharp pain to her right flank, denies radiation. Denies any aggravating or alleviating factors. Patient reports her pain is consistent with pain she has had in the past related to kidney stones. Endorses associated urinary frequency. Denies fever, cough, SOB, CP, abdominal pain, N/V/D, constipation, dysuria, hematuria, blood in stool. Pt denies back pain, numbness, tingling, saddle anesthesia, loss of bowel or bladder, weakness, IVDU, cancer or recent spinal manipulation. Patient denies taking any medications prior to arrival.  Past Medical History  Diagnosis Date  . Osteopenia   . Anemia   . Nephrolithiasis     Hx of  . Osteoporosis     fosamax for over 5 yrs.  . Vaginal cyst     stable  . History of repair of mitral valve     proph for dentist   Past Surgical History  Procedure Laterality Date  . Cardiac surgery  1995    mitral valve repair carpenters ring  . Lithotripsy for kidney stones    . Tonsillectomy    . Ruptured ovarian cyst surgery    . Hemorrhoid surgery    . Mitral valve replacement  07/2003  . Skin graft dog bite  2010   Family History  Problem Relation Age of Onset  . Colon cancer Mother   . Diabetes Brother    Social History  Substance Use Topics  . Smoking status: Never Smoker   . Smokeless tobacco: Never Used  . Alcohol Use: No   OB History    No data available     Review of Systems  Genitourinary: Positive for frequency and flank pain (right).  All other systems reviewed  and are negative.     Allergies  Neosporin  Home Medications   Prior to Admission medications   Medication Sig Start Date End Date Taking? Authorizing Provider  amoxicillin (AMOXIL) 500 MG capsule TAKE 4 CAPSULES BY MOUTH BEFORE DENTAL APPOINTMENT 08/29/15   Abner Greenspan, MD  aspirin 325 MG tablet Take 325 mg by mouth every other day.    Historical Provider, MD  Cholecalciferol (VITAMIN D) 2000 UNITS CAPS Take 2 capsules by mouth daily.    Historical Provider, MD  Flaxseed, Linseed, (FLAX SEED OIL) 1000 MG CAPS Take 1 capsule by mouth daily.     Historical Provider, MD  oxyCODONE-acetaminophen (PERCOCET/ROXICET) 5-325 MG tablet Take 1-2 tablets by mouth every 4 (four) hours as needed for severe pain. 01/24/16   Chesley Noon Letisia Schwalb, PA-C   BP 124/63 mmHg  Pulse 80  Temp(Src) 97.9 F (36.6 C) (Oral)  Resp 16  SpO2 98% Physical Exam  Constitutional: She is oriented to person, place, and time. She appears well-developed and well-nourished. No distress.  HENT:  Head: Normocephalic and atraumatic.  Mouth/Throat: Oropharynx is clear and moist. No oropharyngeal exudate.  Eyes: Conjunctivae and EOM are normal. Right eye exhibits no discharge. Left eye exhibits no discharge. No scleral icterus.  Neck: Normal range of motion. Neck supple.  Cardiovascular: Normal rate, regular rhythm, normal heart sounds and intact distal  pulses.   Pulmonary/Chest: Effort normal and breath sounds normal. No respiratory distress. She has no wheezes. She has no rales. She exhibits no tenderness.  Abdominal: Soft. Bowel sounds are normal. She exhibits no distension and no mass. There is no tenderness. There is no rebound and no guarding.  Right CVA tenderness  Musculoskeletal: Normal range of motion. She exhibits no edema.  No midline C, T, or L tenderness. Full range of motion of neck and back.    Lymphadenopathy:    She has no cervical adenopathy.  Neurological: She is alert and oriented to person,  place, and time.  Skin: Skin is warm and dry. She is not diaphoretic.  Nursing note and vitals reviewed.   ED Course  Procedures (including critical care time) Labs Review Labs Reviewed  URINALYSIS, ROUTINE W REFLEX MICROSCOPIC (NOT AT [facility]) - Abnormal; Notable for the following:    Hgb urine dipstick SMALL (*)    Bilirubin Urine SMALL (*)    Ketones, ur 15 (*)    All other components within normal limits  URINE MICROSCOPIC-ADD ON - Abnormal; Notable for the following:    Squamous Epithelial / LPF 0-5 (*)    All other components within normal limits  COMPREHENSIVE METABOLIC PANEL - Abnormal; Notable for the following:    BUN 22 (*)    Creatinine, Ser 1.05 (*)    GFR calc non Af Amer 53 (*)    All other components within normal limits  CBC WITH DIFFERENTIAL/PLATELET  LIPASE, BLOOD    Imaging Review Ct Renal Stone Study  01/24/2016  CLINICAL DATA:  Right flank pain, onset today. EXAM: CT ABDOMEN AND PELVIS WITHOUT CONTRAST TECHNIQUE: Multidetector CT imaging of the abdomen and pelvis was performed following the standard protocol without IV contrast. COMPARISON:  Most recent CT 08/13/2015 FINDINGS: Lower chest: The included lung bases are clear. Small-moderate hiatal hernia. Liver: No focal lesion allowing for lack contrast. Hepatobiliary: Gallbladder physiologically distended, no calcified stone. No biliary dilatation. Pancreas: No inflammation. The previously described ductal prominence is not well visualized given lack of contrast. Spleen: Normal. Adrenal glands: No nodule. Kidneys: Obstructing 5 mm stone at the right ureterovesicular junction with moderate proximal hydroureteronephrosis. Mild perinephric stranding. There is a punctate nonobstructing stone in the lower right kidney. 5 mm nonobstructing stone in the interpolar left kidney. No left hydronephrosis. Bilateral renal cysts are again seen, not as well characterized currently given lack of contrast. Stomach/Bowel: Stomach  physiologically distended. There are no dilated or thickened small bowel loops. Small volume of stool throughout the colon without colonic wall thickening. Scattered diverticulosis most prominent in the sigmoid colon without acute diverticulitis. The appendix is normal. Vascular/Lymphatic: No retroperitoneal adenopathy. Abdominal aorta is normal in caliber. Mild atherosclerosis without aneurysm. Reproductive: Uterus is normal for age.  No adnexal mass. Bladder: Minimally distended.  No bladder stone or wall thickening. Other: No free air, free fluid, or intra-abdominal fluid collection. Tiny fat containing umbilical hernia per Musculoskeletal: There are no acute or suspicious osseous abnormalities. Scoliotic curvature of the spine and degenerative change. IMPRESSION: 1. Obstructing 5 mm stone at the right ureterovesicular junction with moderate hydroureteronephrosis. 2. Additional nonobstructing stones in both kidneys. 3. Colonic diverticulosis without diverticulitis. Additional chronic findings as described. Electronically Signed   By: Jeb Levering M.D.   On: 01/24/2016 23:44   I have personally reviewed and evaluated these images and lab results as part of my medical decision-making.   EKG Interpretation None      MDM  Final diagnoses:  Right flank pain  Nephrolithiasis    Patient presents with right flank pain with associated urinary frequency. Denies fever, abdominal pain, hematuria, dysuria. VSS. Exam revealed right flank pain, abdominal exam benign. No back pain on exam, no back pain red flags. Pt given IVF and toradol. Urine positive for small hemoglobin, no evidence of associated infection. BUN 22, creatinine 1.05. Remaining labs unremarkable. CT abdomen revealed a obstructing 5 mm stone at right ureterovesicular junction with moderate hydroureteronephrosis. On reevaluation patient denies having any pain. Patient able to tolerate by mouth. Discussed results and plan for discharge with  patient. Plan to discharge patient home with pain meds and symptomatic treatment. Advised patient to follow up with her urologist in the next 3-4 days. Discussed strict return precautions with patient.    Chesley Noon [place], [place] 01/25/16 0007  Leo Grosser, MD 01/25/16 [phone]

## 2016-01-25 NOTE — Discharge Instructions (Signed)
Take your medications as prescribed as needed for pain relief. Continue to drink at least six 8 ounce glasses of water daily to remain hydrated. I recommend straining your urine for the next few days and bring in any stone that passes for further analysis.  I recommend calling your Urologist to schedule a follow up appointment in the next 4-5 days.  Please return to the Emergency Department if symptoms worsen or new onset of fever, abdominal pain, vomiting, unable to keep fluids down, worsening pain, blood in urine, pain with urination.

## 2016-02-17 DIAGNOSIS — N200 Calculus of kidney: Secondary | ICD-10-CM | POA: Diagnosis not present

## 2016-02-17 DIAGNOSIS — N202 Calculus of kidney with calculus of ureter: Secondary | ICD-10-CM | POA: Diagnosis not present

## 2016-02-25 DIAGNOSIS — N200 Calculus of kidney: Secondary | ICD-10-CM | POA: Diagnosis not present

## 2016-07-12 DIAGNOSIS — H40003 Preglaucoma, unspecified, bilateral: Secondary | ICD-10-CM | POA: Diagnosis not present

## 2016-07-19 DIAGNOSIS — H40003 Preglaucoma, unspecified, bilateral: Secondary | ICD-10-CM | POA: Diagnosis not present

## 2016-08-03 DIAGNOSIS — N281 Cyst of kidney, acquired: Secondary | ICD-10-CM | POA: Diagnosis not present

## 2016-08-03 DIAGNOSIS — N200 Calculus of kidney: Secondary | ICD-10-CM | POA: Diagnosis not present

## 2016-09-02 ENCOUNTER — Telehealth: Payer: Self-pay | Admitting: Family Medicine

## 2016-09-02 DIAGNOSIS — Z0000 Encounter for general adult medical examination without abnormal findings: Secondary | ICD-10-CM

## 2016-09-02 DIAGNOSIS — E7800 Pure hypercholesterolemia, unspecified: Secondary | ICD-10-CM

## 2016-09-02 DIAGNOSIS — E559 Vitamin D deficiency, unspecified: Secondary | ICD-10-CM

## 2016-09-02 NOTE — Telephone Encounter (Signed)
-----   Message from Marchia Bond sent at 09/02/2016  2:27 PM EST ----- Regarding: Cpx labs Mon 1/15, need orders. Thanks! :-) Please order  future cpx labs for pt's upcoming lab appt. Thanks Aniceto Boss

## 2016-09-13 ENCOUNTER — Other Ambulatory Visit (INDEPENDENT_AMBULATORY_CARE_PROVIDER_SITE_OTHER): Payer: Medicare Other

## 2016-09-13 DIAGNOSIS — E559 Vitamin D deficiency, unspecified: Secondary | ICD-10-CM | POA: Diagnosis not present

## 2016-09-13 DIAGNOSIS — Z0000 Encounter for general adult medical examination without abnormal findings: Secondary | ICD-10-CM | POA: Diagnosis not present

## 2016-09-13 LAB — CBC WITH DIFFERENTIAL/PLATELET
BASOS ABS: 0 10*3/uL (ref 0.0–0.1)
BASOS PCT: 0.4 % (ref 0.0–3.0)
Eosinophils Absolute: 0.2 10*3/uL (ref 0.0–0.7)
Eosinophils Relative: 4 % (ref 0.0–5.0)
HCT: 35.1 % — ABNORMAL LOW (ref 36.0–46.0)
Hemoglobin: 11.7 g/dL — ABNORMAL LOW (ref 12.0–15.0)
LYMPHS ABS: 1.6 10*3/uL (ref 0.7–4.0)
LYMPHS PCT: 34.8 % (ref 12.0–46.0)
MCHC: 33.4 g/dL (ref 30.0–36.0)
MCV: 87.9 fl (ref 78.0–100.0)
MONO ABS: 0.4 10*3/uL (ref 0.1–1.0)
Monocytes Relative: 8.3 % (ref 3.0–12.0)
NEUTROS ABS: 2.5 10*3/uL (ref 1.4–7.7)
NEUTROS PCT: 52.5 % (ref 43.0–77.0)
PLATELETS: 163 10*3/uL (ref 150.0–400.0)
RBC: 3.99 Mil/uL (ref 3.87–5.11)
RDW: 13.8 % (ref 11.5–15.5)
WBC: 4.7 10*3/uL (ref 4.0–10.5)

## 2016-09-13 LAB — COMPREHENSIVE METABOLIC PANEL
ALT: 15 U/L (ref 0–35)
AST: 18 U/L (ref 0–37)
Albumin: 4.2 g/dL (ref 3.5–5.2)
Alkaline Phosphatase: 48 U/L (ref 39–117)
BUN: 17 mg/dL (ref 6–23)
CHLORIDE: 108 meq/L (ref 96–112)
CO2: 28 meq/L (ref 19–32)
CREATININE: 0.83 mg/dL (ref 0.40–1.20)
Calcium: 9.2 mg/dL (ref 8.4–10.5)
GFR: 72.15 mL/min (ref >60.00)
Glucose, Bld: 86 mg/dL (ref 70–99)
Potassium: 4.5 mEq/L (ref 3.5–5.1)
SODIUM: 140 meq/L (ref 135–145)
Total Bilirubin: 0.3 mg/dL (ref 0.2–1.2)
Total Protein: 6.9 g/dL (ref 6.0–8.3)

## 2016-09-13 LAB — LIPID PANEL
CHOLESTEROL: 183 mg/dL (ref 0–200)
HDL: 69 mg/dL (ref >39.00)
LDL Cholesterol: 102 mg/dL — ABNORMAL HIGH (ref 0–99)
NonHDL: 114.32
TRIGLYCERIDES: 63 mg/dL (ref 0.0–149.0)
Total CHOL/HDL Ratio: 3
VLDL: 12.6 mg/dL (ref 0.0–40.0)

## 2016-09-13 LAB — TSH: TSH: 2.11 u[IU]/mL (ref 0.35–4.50)

## 2016-09-13 LAB — VITAMIN D 25 HYDROXY (VIT D DEFICIENCY, FRACTURES): VITD: 61.1 ng/mL (ref 30.00–100.00)

## 2016-09-20 ENCOUNTER — Encounter: Payer: Self-pay | Admitting: Family Medicine

## 2016-09-20 ENCOUNTER — Encounter: Payer: Self-pay | Admitting: Gastroenterology

## 2016-09-20 ENCOUNTER — Ambulatory Visit (INDEPENDENT_AMBULATORY_CARE_PROVIDER_SITE_OTHER): Payer: Medicare Other | Admitting: Family Medicine

## 2016-09-20 VITALS — BP 132/80 | HR 78 | Temp 98.1°F | Ht 62.0 in | Wt 125.8 lb

## 2016-09-20 DIAGNOSIS — E559 Vitamin D deficiency, unspecified: Secondary | ICD-10-CM | POA: Diagnosis not present

## 2016-09-20 DIAGNOSIS — E7800 Pure hypercholesterolemia, unspecified: Secondary | ICD-10-CM

## 2016-09-20 DIAGNOSIS — Z1211 Encounter for screening for malignant neoplasm of colon: Secondary | ICD-10-CM

## 2016-09-20 DIAGNOSIS — M8589 Other specified disorders of bone density and structure, multiple sites: Secondary | ICD-10-CM | POA: Diagnosis not present

## 2016-09-20 DIAGNOSIS — D649 Anemia, unspecified: Secondary | ICD-10-CM

## 2016-09-20 DIAGNOSIS — Z0000 Encounter for general adult medical examination without abnormal findings: Secondary | ICD-10-CM | POA: Diagnosis not present

## 2016-09-20 DIAGNOSIS — D696 Thrombocytopenia, unspecified: Secondary | ICD-10-CM

## 2016-09-20 NOTE — Assessment & Plan Note (Signed)
Rev dexa 1/17 No falls or new fractures  On ca and D D level is tx   Disc need for calcium/ vitamin D/ wt bearing exercise and bone density test every 2 y to monitor Disc safety/ fracture risk in detail

## 2016-09-20 NOTE — Patient Instructions (Addendum)
Don't forget to schedule your own mammogram- you are due  Stop at check out for referral for colonoscopy   Glad you are doing well  Keep taking good care of yourself

## 2016-09-20 NOTE — Assessment & Plan Note (Signed)
Referred for screening colonoscopy  Due for 5 y f/u due to fam hx

## 2016-09-20 NOTE — Assessment & Plan Note (Signed)
Mild intermittent anemia with nl mcv occ low platelet ct-not today  Per pt this has been on and off Has a balanced diet  She is due for a colonoscopy - ref for that today

## 2016-09-20 NOTE — Assessment & Plan Note (Signed)
Platelet ct is normal this check  Will continue to follow No bruising or bleeding  Mildly anemic with nl mcv

## 2016-09-20 NOTE — Assessment & Plan Note (Signed)
Disc goals for lipids and reasons to control them Rev labs with pt Rev low sat fat diet in detail Good control with diet currently  

## 2016-09-20 NOTE — Assessment & Plan Note (Signed)
Reviewed health habits including diet and exercise and skin cancer prevention Reviewed appropriate screening tests for age  Also reviewed health mt list, fam hx and immunization status , as well as social and family history   See HPI Labs reviewed  Did refer for her screening colonoscopy for family hx  She will make her own mammogram appt

## 2016-09-20 NOTE — Progress Notes (Signed)
Subjective:    Patient ID: Angelica Rivers, female    DOB: 02-10-1946, 71 y.o.   MRN: [id number]  HPI Here for health maintenance exam and to review chronic medical problems   Feeling fine   Has not had AMW yet   Wt Readings from Last 3 Encounters:  09/20/16 125 lb 12 oz (57 kg)  09/15/15 126 lb (57.2 kg)  03/28/15 133 lb 12 oz (60.7 kg)   bmi 23 and stable    Pap 2/13-normal No problems or complaints  She has hx of a vaginal cyst - not bothersome and no change per pt at all   Mammogram 12/16-normal  (she will schedule her own mammogram)  Self breast exam -no lumps   Colonoscopy 2/10- with diverticulosis/ 5 year recall  Mother had colon cancer Due for that - we will do the referral   Tetanus shot 7/15    zoster vacc 5/14 PNA completed Flu shot 10/17  dexa 1/17- osteopenia slt worse at hips No px meds  Takes her ca and D No falls or fx Has had finger fracture in the past  Good D level at 61.1  Hx of hyperlipidemia Lab Results  Component Value Date   CHOL 183 09/13/2016   CHOL 185 09/12/2015   CHOL 186 12/10/2013   Lab Results  Component Value Date   HDL 69.00 09/13/2016   HDL 62.00 09/12/2015   HDL 64.30 12/10/2013   Lab Results  Component Value Date   LDLCALC 102 (H) 09/13/2016   LDLCALC 110 (H) 09/12/2015   LDLCALC 110 (H) 12/10/2013   Lab Results  Component Value Date   TRIG 63.0 09/13/2016   TRIG 66.0 09/12/2015   TRIG 59.0 12/10/2013   Lab Results  Component Value Date   CHOLHDL 3 09/13/2016   CHOLHDL 3 09/12/2015   CHOLHDL 3 12/10/2013   Lab Results  Component Value Date   LDLDIRECT 117.7 10/06/2011   LDLDIRECT 127.8 09/22/2010   LDLDIRECT 120.8 09/12/2008  eats a healthy diet  She eats red meat once per week -otherwise very low cholesterol diet    Hx of thrombocytopenia and anemia in the past Lab Results  Component Value Date   WBC 4.7 09/13/2016   HGB 11.7 (L) 09/13/2016   HCT 35.1 (L) 09/13/2016   MCV 87.9 09/13/2016   PLT 163.0 09/13/2016   this time platelets nl  Hb is down from 13.4 to 11.7  Pt says she has had to take iron in the past- every since her heart valve surgery   No heart symptoms     Chemistry      Component Value Date/Time   NA 140 09/13/2016 0902   NA 140 09/30/2009 1258   K 4.5 09/13/2016 0902   K 4.0 09/30/2009 1258   CL 108 09/13/2016 0902   CL 103 09/30/2009 1258   CO2 28 09/13/2016 0902   CO2 30 09/30/2009 1258   BUN 17 09/13/2016 0902   BUN 17 09/30/2009 1258   CREATININE 0.83 09/13/2016 0902   CREATININE 0.9 09/30/2009 1258      Component Value Date/Time   CALCIUM 9.2 09/13/2016 0902   CALCIUM 9.6 09/30/2009 1258   ALKPHOS 48 09/13/2016 0902   ALKPHOS 56 09/30/2009 1258   AST 18 09/13/2016 0902   AST 23 09/30/2009 1258   ALT 15 09/13/2016 0902   ALT 21 09/30/2009 1258   BILITOT 0.3 09/13/2016 0902   BILITOT 0.50 09/30/2009 1258     Lab Results  Component Value Date   TSH 2.11 09/13/2016     Patient Active Problem List   Diagnosis Date Noted  . Anemia 09/20/2016  . Estrogen deficiency 09/15/2015  . Colon cancer screening 12/17/2013  . Encounter for Medicare annual wellness exam 12/08/2013  . Routine general medical examination at a health care facility 12/16/2012  . Gynecological examination 10/13/2011  . Vitamin D deficiency 09/23/2009  . Thrombocytopenia ([place]) 09/23/2009  . Hyperlipidemia 09/12/2008  . FIBROCYSTIC BREAST DISEASE 09/08/2007  . Osteopenia 09/08/2007  . NEPHROLITHIASIS, HX OF 09/08/2007   Past Medical History:  Diagnosis Date  . Anemia   . History of repair of mitral valve    proph for dentist  . Nephrolithiasis    Hx of  . Osteopenia   . Osteoporosis    fosamax for over 5 yrs.  . Vaginal cyst    stable   Past Surgical History:  Procedure Laterality Date  . CARDIAC SURGERY  1995   mitral valve repair carpenters ring  . HEMORRHOID SURGERY    . lithotripsy for kidney stones    . MITRAL VALVE REPLACEMENT  07/2003  .  ruptured ovarian cyst surgery    . skin graft dog bite  2010  . TONSILLECTOMY     Social History  Substance Use Topics  . Smoking status: Never Smoker  . Smokeless tobacco: Never Used  . Alcohol use No   Family History  Problem Relation Age of Onset  . Colon cancer Mother   . Diabetes Brother    Allergies  Allergen Reactions  . Neosporin [Neomycin-Bacitracin Zn-Polymyx]    Current Outpatient Prescriptions on File Prior to Visit  Medication Sig Dispense Refill  . amoxicillin (AMOXIL) 500 MG capsule TAKE 4 CAPSULES BY MOUTH BEFORE DENTAL APPOINTMENT 4 capsule 4  . aspirin 325 MG tablet Take 325 mg by mouth every other day.    . Cholecalciferol (VITAMIN D) 2000 UNITS CAPS Take 2 capsules by mouth daily.    . Flaxseed, Linseed, (FLAX SEED OIL) 1000 MG CAPS Take 1 capsule by mouth daily.      No current facility-administered medications on file prior to visit.      Review of Systems Review of Systems  Constitutional: Negative for fever, appetite change, fatigue and unexpected weight change.  Eyes: Negative for pain and visual disturbance.  Respiratory: Negative for cough and shortness of breath.   Cardiovascular: Negative for cp or palpitations    Gastrointestinal: Negative for nausea, diarrhea and constipation.  Genitourinary: Negative for urgency and frequency.  Skin: Negative for pallor or rash   Neurological: Negative for weakness, light-headedness, numbness and headaches.  Hematological: Negative for adenopathy. Does not bruise/bleed easily.  Psychiatric/Behavioral: Negative for dysphoric mood. The patient is not nervous/anxious.         Objective:   Physical Exam  Constitutional: She appears well-developed and well-nourished. No distress.  Well appearing elderly female  HENT:  Head: Normocephalic and atraumatic.  Right Ear: External ear normal.  Left Ear: External ear normal.  Mouth/Throat: Oropharynx is clear and moist.  Eyes: Conjunctivae and EOM are normal.  Pupils are equal, round, and reactive to light. No scleral icterus.  Neck: Normal range of motion. Neck supple. No JVD present. Carotid bruit is not present. No thyromegaly present.  Cardiovascular: Normal rate, regular rhythm, normal heart sounds and intact distal pulses.  Exam reveals no gallop.   No M heard  Pulmonary/Chest: Effort normal and breath sounds normal. No respiratory distress. She has no wheezes. She  exhibits no tenderness.  Abdominal: Soft. Bowel sounds are normal. She exhibits no distension, no abdominal bruit and no mass. There is no tenderness.  Baseline midline scar  Genitourinary: No breast swelling, tenderness, discharge or bleeding.  Genitourinary Comments: Breast exam: No mass, nodules, thickening, tenderness, bulging, retraction, inflamation, nipple discharge or skin changes noted.  No axillary or clavicular LA.      Musculoskeletal: Normal range of motion. She exhibits no edema or tenderness.  Lymphadenopathy:    She has no cervical adenopathy.  Neurological: She is alert. She has normal reflexes. No cranial nerve deficit. She exhibits normal muscle tone. Coordination normal.  Skin: Skin is warm and dry. No rash noted. No erythema. No pallor.  Fair  Some light lentigines   Old burn noted on R forearm/unchanged with scarring  Psychiatric: She has a normal mood and affect.          Assessment & Plan:   Problem List Items Addressed This Visit      Musculoskeletal and Integument   Osteopenia - Primary    Rev dexa 1/17 No falls or new fractures  On ca and D D level is tx   Disc need for calcium/ vitamin D/ wt bearing exercise and bone density test every 2 y to monitor Disc safety/ fracture risk in detail          Other   Anemia    Mild intermittent anemia with nl mcv occ low platelet ct-not today  Per pt this has been on and off Has a balanced diet  She is due for a colonoscopy - ref for that today      Colon cancer screening    Referred for  screening colonoscopy  Due for 5 y f/u due to fam hx       Relevant Orders   Ambulatory referral to Gastroenterology   Hyperlipidemia    Disc goals for lipids and reasons to control them Rev labs with pt Rev low sat fat diet in detail Good control with diet currently       Routine general medical examination at a health care facility    Reviewed health habits including diet and exercise and skin cancer prevention Reviewed appropriate screening tests for age  Also reviewed health mt list, fam hx and immunization status , as well as social and family history   See HPI Labs reviewed  Did refer for her screening colonoscopy for family hx  She will make her own mammogram appt      Thrombocytopenia ([place])    Platelet ct is normal this check  Will continue to follow No bruising or bleeding  Mildly anemic with nl mcv       Vitamin D deficiency    Vitamin D level is therapeutic with current supplementation Disc importance of this to bone and overall health

## 2016-09-20 NOTE — Assessment & Plan Note (Signed)
Vitamin D level is therapeutic with current supplementation Disc importance of this to bone and overall health  

## 2016-09-20 NOTE — Progress Notes (Signed)
Pre visit review using our clinic review tool, if applicable. No additional management support is needed unless otherwise documented below in the visit note. 

## 2016-09-23 ENCOUNTER — Ambulatory Visit (INDEPENDENT_AMBULATORY_CARE_PROVIDER_SITE_OTHER): Payer: Medicare Other

## 2016-09-23 VITALS — BP 110/66 | HR 83 | Temp 97.9°F | Ht 62.0 in | Wt 125.5 lb

## 2016-09-23 DIAGNOSIS — Z0000 Encounter for general adult medical examination without abnormal findings: Secondary | ICD-10-CM

## 2016-09-23 DIAGNOSIS — Z1159 Encounter for screening for other viral diseases: Secondary | ICD-10-CM | POA: Diagnosis not present

## 2016-09-23 LAB — HEPATITIS C ANTIBODY: HCV Ab: NEGATIVE

## 2016-09-23 NOTE — Progress Notes (Signed)
PCP notes:   Health maintenance:  Hep C screening - completed Colonoscopy - pt will schedule future appt Pap smear - pt will complete at future date if needed  Abnormal screenings:   Hearing - failed  Patient concerns:   None  Nurse concerns:  None  Next PCP appt:   N/A; CPE in Jan 2018

## 2016-09-23 NOTE — Progress Notes (Signed)
Subjective:   Angelica Rivers is a 71 y.o. female who presents for Medicare Annual (Subsequent) preventive examination.  Review of Systems:  N/A Cardiac Risk Factors include: advanced age (>61men, >32 women);dyslipidemia     Objective:     Vitals: BP 110/66 (BP Location: Right Arm, Patient Position: Sitting, Cuff Size: Normal)   Pulse 83   Temp 97.9 F (36.6 C) (Oral)   Ht 5\' 2"  (1.575 m)   Wt 125 lb 8 oz (56.9 kg)   SpO2 98%   BMI 22.95 kg/m   Body mass index is 22.95 kg/m.   Tobacco History  Smoking Status  . Never Smoker  Smokeless Tobacco  . Never Used     Counseling given: No   Past Medical History:  Diagnosis Date  . Anemia   . History of repair of mitral valve    proph for dentist  . Nephrolithiasis    Hx of  . Osteopenia   . Osteoporosis    fosamax for over 5 yrs.  . Vaginal cyst    stable   Past Surgical History:  Procedure Laterality Date  . CARDIAC SURGERY  1995   mitral valve repair carpenters ring  . HEMORRHOID SURGERY    . lithotripsy for kidney stones    . MITRAL VALVE REPLACEMENT  07/2003  . ruptured ovarian cyst surgery    . skin graft dog bite  2010  . TONSILLECTOMY     Family History  Problem Relation Age of Onset  . Colon cancer Mother   . Diabetes Brother    History  Sexual Activity  . Sexual activity: No    Outpatient Encounter Prescriptions as of 09/23/2016  Medication Sig  . amoxicillin (AMOXIL) 500 MG capsule TAKE 4 CAPSULES BY MOUTH BEFORE DENTAL APPOINTMENT  . aspirin 325 MG tablet Take 325 mg by mouth every other day.  . Cholecalciferol (VITAMIN D) 2000 UNITS CAPS Take 2 capsules by mouth daily.  . Flaxseed, Linseed, (FLAX SEED OIL) 1000 MG CAPS Take 1 capsule by mouth daily.    No facility-administered encounter medications on file as of 09/23/2016.     Activities of Daily Living In your present state of health, do you have any difficulty performing the following activities: 09/23/2016  Hearing? N  Vision?  N  Difficulty concentrating or making decisions? N  Walking or climbing stairs? N  Dressing or bathing? N  Doing errands, shopping? Y  Preparing Food and eating ? N  Using the Toilet? N  In the past six months, have you accidently leaked urine? N  Do you have problems with loss of bowel control? N  Managing your Medications? N  Managing your Finances? N  Housekeeping or managing your Housekeeping? N  Some recent data might be hidden    Patient Care Team: Abner Greenspan, MD as PCP - General    Assessment:     Hearing Screening   125Hz  250Hz  500Hz  1000Hz  2000Hz  3000Hz  4000Hz  6000Hz  8000Hz   Right ear:   40 40 40  0    Left ear:   40 0 40  0    Vision Screening Comments: Last vision exam in Sept 2017 with Dr. Hassell Done   Exercise Activities and Dietary recommendations Current Exercise Habits: Home exercise routine, Type of exercise: walking, Time (Minutes): 60, Frequency (Times/Week): 7, Weekly Exercise (Minutes/Week): 420, Intensity: Mild, Exercise limited by: None identified  Goals    . Increase physical activity  Starting 09/23/2016, I will continue to walk at least 60 min daily.       Fall Risk Fall Risk  09/23/2016 09/20/2016 09/15/2015 12/17/2013 12/25/2012  Falls in the past year? No Yes No No Yes  Number falls in past yr: - - - - 1   Depression Screen PHQ 2/9 Scores 09/23/2016 09/20/2016 09/15/2015 12/17/2013  PHQ - 2 Score 0 0 0 0     Cognitive Function MMSE - Mini Mental State Exam 09/23/2016  Orientation to time 5  Orientation to Place 5  Registration 3  Attention/ Calculation 0  Recall 3  Language- name 2 objects 0  Language- repeat 1  Language- follow 3 step command 3  Language- read & follow direction 0  Write a sentence 0  Copy design 0  Total score 20     PLEASE NOTE: A Mini-Cog screen was completed. Maximum score is 20. A value of 0 denotes this part of Folstein MMSE was not completed or the patient failed this part of the Mini-Cog screening.    Mini-Cog Screening Orientation to Time - Max 5 pts Orientation to Place - Max 5 pts Registration - Max 3 pts Recall - Max 3 pts Language Repeat - Max 1 pts Language Follow 3 Step Command - Max 3 pts     Immunization History  Administered Date(s) Administered  . 19-influenza Whole 06/14/2015  . Influenza Whole 09/08/2006  . Influenza, High Dose Seasonal PF 06/21/2014, 06/23/2016  . Influenza-Unspecified 05/30/2013  . Pneumococcal Conjugate-13 12/17/2013  . Pneumococcal Polysaccharide-23 10/13/2011  . Td 04/20/2002, 02/27/2009  . Tdap 12/25/2012, 03/06/2014  . Zoster 01/24/2013   Screening Tests Health Maintenance  Topic Date Due  . MAMMOGRAM  07/29/2017 (Originally 07/30/2016)  . PAP SMEAR  09/23/2017 (Originally 10/12/2012)  . COLONOSCOPY  10/11/2018  . TETANUS/TDAP  03/06/2024  . INFLUENZA VACCINE  Addressed  . DEXA SCAN  Completed  . ZOSTAVAX  Completed  . Hepatitis C Screening  Completed  . PNA vac Low Risk Adult  Completed      Plan:     I have personally reviewed and addressed the Medicare Annual Wellness questionnaire and have noted the following in the patient's chart:  A. Medical and social history B. Use of alcohol, tobacco or illicit drugs  C. Current medications and supplements D. Functional ability and status E.  Nutritional status F.  Physical activity G. Advance directives H. List of other physicians I.  Hospitalizations, surgeries, and ER visits in previous 12 months J.  Reader to include hearing, vision, cognitive, depression L. Referrals and appointments - none  In addition, I have reviewed and discussed with patient certain preventive protocols, quality metrics, and best practice recommendations. A written personalized care plan for preventive services as well as general preventive health recommendations were provided to patient.  See attached scanned questionnaire for additional information.   Signed,   Lindell Noe, MHA,  BS, LPN Health Coach

## 2016-09-23 NOTE — Progress Notes (Signed)
Pre visit review using our clinic review tool, if applicable. No additional management support is needed unless otherwise documented below in the visit note. 

## 2016-09-23 NOTE — Progress Notes (Signed)
Medical screening examination/treatment/procedure(s) were performed by registered nurse and as supervising non-physician practitioner, I was immediately available for consultation/collaboration.   Loie Jahr, NP  

## 2016-09-23 NOTE — Patient Instructions (Signed)
Angelica Rivers , Thank you for taking time to come for your Medicare Wellness Visit. I appreciate your ongoing commitment to your health goals. Please review the following plan we discussed and let me know if I can assist you in the future.   These are the goals we discussed: Goals    . Increase physical activity          Starting 09/23/2016, I will continue to walk at least 60 min daily.        This is a list of the screening recommended for you and due dates:  Health Maintenance  Topic Date Due  . Mammogram  07/29/2017*  . Pap Smear  09/23/2017*  . Colon Cancer Screening  10/11/2018  . Tetanus Vaccine  03/06/2024  . Flu Shot  Addressed  . DEXA scan (bone density measurement)  Completed  . Shingles Vaccine  Completed  .  Hepatitis C: One time screening is recommended by Center for Disease Control  (CDC) for  adults born from [birth year] through 1965.   Completed  . Pneumonia vaccines  Completed  *Topic was postponed. The date shown is not the original due date.   Preventive Care for Adults  A healthy lifestyle and preventive care can promote health and wellness. Preventive health guidelines for adults include the following key practices.  . A routine yearly physical is a good way to check with your health care provider about your health and preventive screening. It is a chance to share any concerns and updates on your health and to receive a thorough exam.  . Visit your dentist for a routine exam and preventive care every 6 months. Brush your teeth twice a day and floss once a day. Good oral hygiene prevents tooth decay and gum disease.  . The frequency of eye exams is based on your age, health, family medical history, use  of contact lenses, and other factors. Follow your health care provider's ecommendations for frequency of eye exams.  . Eat a healthy diet. Foods like vegetables, fruits, whole grains, low-fat dairy products, and lean protein foods contain the nutrients you need without  too many calories. Decrease your intake of foods high in solid fats, added sugars, and salt. Eat the right amount of calories for you. Get information about a proper diet from your health care provider, if necessary.  . Regular physical exercise is one of the most important things you can do for your health. Most adults should get at least 150 minutes of moderate-intensity exercise (any activity that increases your heart rate and causes you to sweat) each week. In addition, most adults need muscle-strengthening exercises on 2 or more days a week.  Silver Sneakers may be a benefit available to you. To determine eligibility, you may visit the website: www.silversneakers.com or contact program at 972-478-8670 Mon-Fri between 8AM-8PM.   . Maintain a healthy weight. The body mass index (BMI) is a screening tool to identify possible weight problems. It provides an estimate of body fat based on height and weight. Your health care provider can find your BMI and can help you achieve or maintain a healthy weight.   For adults 20 years and older: ? A BMI below 18.5 is considered underweight. ? A BMI of 18.5 to 24.9 is normal. ? A BMI of 25 to 29.9 is considered overweight. ? A BMI of 30 and above is considered obese.   . Maintain normal blood lipids and cholesterol levels by exercising and minimizing your intake of saturated  fat. Eat a balanced diet with plenty of fruit and vegetables. Blood tests for lipids and cholesterol should begin at age 69 and be repeated every 5 years. If your lipid or cholesterol levels are high, you are over 50, or you are at high risk for heart disease, you may need your cholesterol levels checked more frequently. Ongoing high lipid and cholesterol levels should be treated with medicines if diet and exercise are not working.  . If you smoke, find out from your health care provider how to quit. If you do not use tobacco, please do not start.  . If you choose to drink alcohol,  please do not consume more than 2 drinks per day. One drink is considered to be 12 ounces (355 mL) of beer, 5 ounces (148 mL) of wine, or 1.5 ounces (44 mL) of liquor.  . If you are 70-57 years old, ask your health care provider if you should take aspirin to prevent strokes.  . Use sunscreen. Apply sunscreen liberally and repeatedly throughout the day. You should seek shade when your shadow is shorter than you. Protect yourself by wearing long sleeves, pants, a wide-brimmed hat, and sunglasses year round, whenever you are outdoors.  . Once a month, do a whole body skin exam, using a mirror to look at the skin on your back. Tell your health care provider of new moles, moles that have irregular borders, moles that are larger than a pencil eraser, or moles that have changed in shape or color.

## 2016-10-05 ENCOUNTER — Other Ambulatory Visit: Payer: Self-pay | Admitting: Family Medicine

## 2016-10-05 NOTE — Telephone Encounter (Signed)
done

## 2016-10-05 NOTE — Telephone Encounter (Signed)
Last filled on 08/29/15 #4 tabs with 4 additional refills, please advise

## 2016-10-05 NOTE — Telephone Encounter (Signed)
Please refill times 5 

## 2016-10-18 DIAGNOSIS — H40003 Preglaucoma, unspecified, bilateral: Secondary | ICD-10-CM | POA: Diagnosis not present

## 2016-10-29 ENCOUNTER — Encounter: Payer: Self-pay | Admitting: Gastroenterology

## 2016-10-29 ENCOUNTER — Ambulatory Visit (AMBULATORY_SURGERY_CENTER): Payer: Self-pay | Admitting: *Deleted

## 2016-10-29 VITALS — Ht 62.0 in | Wt 130.0 lb

## 2016-10-29 DIAGNOSIS — Z800 Family history of malignant neoplasm of digestive organs: Secondary | ICD-10-CM

## 2016-10-29 MED ORDER — NA SULFATE-K SULFATE-MG SULF 17.5-3.13-1.6 GM/177ML PO SOLN: 1.0000 | Freq: Once | ORAL | 0 refills | Status: AC

## 2016-10-29 NOTE — Progress Notes (Signed)
Denies allergies to eggs or soy products. Denies complications with sedation or anesthesia. Denies O2 use. Denies use of diet or weight loss medications.  Emmi instructions declined for colonoscopy.  

## 2016-11-10 ENCOUNTER — Encounter: Payer: Self-pay | Admitting: Gastroenterology

## 2016-11-10 ENCOUNTER — Ambulatory Visit (AMBULATORY_SURGERY_CENTER): Payer: Medicare Other | Admitting: Gastroenterology

## 2016-11-10 VITALS — BP 110/67 | HR 68 | Temp 96.4°F | Resp 11 | Ht 62.0 in | Wt 130.0 lb

## 2016-11-10 DIAGNOSIS — D123 Benign neoplasm of transverse colon: Secondary | ICD-10-CM

## 2016-11-10 DIAGNOSIS — Z1212 Encounter for screening for malignant neoplasm of rectum: Secondary | ICD-10-CM

## 2016-11-10 DIAGNOSIS — Z800 Family history of malignant neoplasm of digestive organs: Secondary | ICD-10-CM

## 2016-11-10 DIAGNOSIS — Z1211 Encounter for screening for malignant neoplasm of colon: Secondary | ICD-10-CM

## 2016-11-10 DIAGNOSIS — K5730 Diverticulosis of large intestine without perforation or abscess without bleeding: Secondary | ICD-10-CM

## 2016-11-10 MED ORDER — SODIUM CHLORIDE 0.9 % IV SOLN: 500.0000 mL | INTRAVENOUS | Status: DC

## 2016-11-10 NOTE — Progress Notes (Signed)
Patient awakening,vss,report to rn 

## 2016-11-10 NOTE — Op Note (Signed)
[facility] Patient Name: Angelica Rivers Procedure Date: 11/10/2016 10:59 AM MRN: [id number] Endoscopist: Milus Banister , MD Age: 71 Referring MD:  Date of Birth: [birth year] Gender: Female Account #: [id number] Procedure:                Colonoscopy Indications:              Screening in patient at increased risk: Family                            history of 1st-degree relative with colorectal                            cancer Medicines:                Monitored Anesthesia Care Procedure:                Pre-Anesthesia Assessment:                           - Prior to the procedure, a History and Physical                            was performed, and patient medications and                            allergies were reviewed. The patient's tolerance of                            previous anesthesia was also reviewed. The risks                            and benefits of the procedure and the sedation                            options and risks were discussed with the patient.                            All questions were answered, and informed consent                            was obtained. Prior Anticoagulants: The patient has                            taken no previous anticoagulant or antiplatelet                            agents. ASA Grade Assessment: II - A patient with                            mild systemic disease. After reviewing the risks                            and benefits, the patient was deemed in  satisfactory condition to undergo the procedure.                           After obtaining informed consent, the colonoscope                            was passed under direct vision. Throughout the                            procedure, the patient's blood pressure, pulse, and                            oxygen saturations were monitored continuously. The                            Colonoscope was introduced through the anus and                      advanced to the the cecum, identified by                            appendiceal orifice and ileocecal valve. The                            colonoscopy was performed without difficulty. The                            patient tolerated the procedure well. The quality                            of the bowel preparation was excellent. The                            ileocecal valve, appendiceal orifice, and rectum                            were photographed. Scope In: 11:01:57 AM Scope Out: 11:12:43 AM Scope Withdrawal Time: 0 hours 7 minutes 28 seconds  Total Procedure Duration: 0 hours 10 minutes 46 seconds  Findings:                 A 3 mm polyp was found in the transverse colon. The                            polyp was sessile. The polyp was removed with a                            cold snare. Resection and retrieval were complete.                           A few small-mouthed diverticula were found in the                            left colon.  The exam was otherwise without abnormality on                            direct and retroflexion views. Complications:            No immediate complications. Estimated blood loss:                            None. Estimated Blood Loss:     Estimated blood loss: none. Impression:               - One 3 mm polyp in the transverse colon, removed                            with a cold snare. Resected and retrieved.                           - Diverticulosis in the left colon.                           - The examination was otherwise normal on direct                            and retroflexion views. Recommendation:           - Patient has a contact number available for                            emergencies. The signs and symptoms of potential                            delayed complications were discussed with the                            patient. Return to normal activities tomorrow.                             Written discharge instructions were provided to the                            patient.                           - Resume previous diet.                           - Continue present medications.                           - Repeat colonoscopy is recommended. The                            colonoscopy date will be determined after pathology                            results from today's exam become available for  review. Likely will recommend repeat colonoscopy in                            5 years. Milus Banister, MD 11/10/2016 11:19:04 AM This report has been signed electronically.

## 2016-11-10 NOTE — Progress Notes (Signed)
Called to room to assist during endoscopic procedure.  Patient ID and intended procedure confirmed with present staff. Received instructions for my participation in the procedure from the performing physician.  

## 2016-11-10 NOTE — Progress Notes (Signed)
No changes in medical or surgical hx since PV.  No home oxygen used or hx of sleep apnea

## 2016-11-10 NOTE — Patient Instructions (Signed)
YOU HAD AN ENDOSCOPIC PROCEDURE TODAY AT THE [facility]:   Refer to the procedure report that was given to you for any specific questions about what was found during the examination.  If the procedure report does not answer your questions, please call your gastroenterologist to clarify.  If you requested that your care partner not be given the details of your procedure findings, then the procedure report has been included in a sealed envelope for you to review at your convenience later.  YOU SHOULD EXPECT: Some feelings of bloating in the abdomen. Passage of more gas than usual.  Walking can help get rid of the air that was put into your GI tract during the procedure and reduce the bloating. If you had a lower endoscopy (such as a colonoscopy or flexible sigmoidoscopy) you may notice spotting of blood in your stool or on the toilet paper. If you underwent a bowel prep for your procedure, you may not have a normal bowel movement for a few days.  Please Note:  You might notice some irritation and congestion in your nose or some drainage.  This is from the oxygen used during your procedure.  There is no need for concern and it should clear up in a day or so.  SYMPTOMS TO REPORT IMMEDIATELY:   Following lower endoscopy (colonoscopy or flexible sigmoidoscopy):  Excessive amounts of blood in the stool  Significant tenderness or worsening of abdominal pains  Swelling of the abdomen that is new, acute  Fever of 100F or higher    For urgent or emergent issues, a gastroenterologist can be reached at any hour by calling [phone].   DIET:  We do recommend a small meal at first, but then you may proceed to your regular diet.  Drink plenty of fluids but you should avoid alcoholic beverages for 24 hours.  ACTIVITY:  You should plan to take it easy for the rest of today and you should NOT DRIVE or use heavy machinery until tomorrow (because of the sedation medicines used during the test).     FOLLOW UP: Our staff will call the number listed on your records the next business day following your procedure to check on you and address any questions or concerns that you may have regarding the information given to you following your procedure. If we do not reach you, we will leave a message.  However, if you are feeling well and you are not experiencing any problems, there is no need to return our call.  We will assume that you have returned to your regular daily activities without incident.  If any biopsies were taken you will be contacted by phone or by letter within the next 1-3 weeks.  Please call us at [phone] if you have not heard about the biopsies in 3 weeks.    SIGNATURES/CONFIDENTIALITY: You and/or your care partner have signed paperwork which will be entered into your electronic medical record.  These signatures attest to the fact that that the information above on your After Visit Summary has been reviewed and is understood.  Full responsibility of the confidentiality of this discharge information lies with you and/or your care-partner.   Resume medications. Information given on polyps,diverticulosis and high fiber diet. 

## 2016-11-11 ENCOUNTER — Telehealth: Payer: Self-pay | Admitting: *Deleted

## 2016-11-11 NOTE — Telephone Encounter (Signed)
  Follow up Call-  Call back number 11/10/2016  Post procedure Call Back phone  # [phone]  Permission to leave phone message Yes  Some recent data might be hidden     Patient questions:  Do you have a fever, pain , or abdominal swelling? No. Pain Score  0 *  Have you tolerated food without any problems? Yes.    Have you been able to return to your normal activities? Yes.    Do you have any questions about your discharge instructions: Diet   No. Medications  No. Follow up visit  No.  Do you have questions or concerns about your Care? No.  Actions: * If pain score is 4 or above: No action needed, pain <4.

## 2016-11-16 ENCOUNTER — Encounter: Payer: Self-pay | Admitting: Gastroenterology

## 2016-12-31 IMAGING — CT CT RENAL STONE PROTOCOL
2 of 3 series · 16 of 46 positions shown, 18 images · non-contrast
Comparison: Most recent CT 08/13/2015

CLINICAL DATA: Right flank pain, onset today.

EXAM:
CT ABDOMEN AND PELVIS WITHOUT CONTRAST
TECHNIQUE: Multidetector CT imaging of the abdomen and pelvis was performed
following the standard protocol without IV contrast.

[Series 3: coronal · coronal · 0.74mm/px · 3 of 118 slices shown]
[im 40/118  soft-tissue]
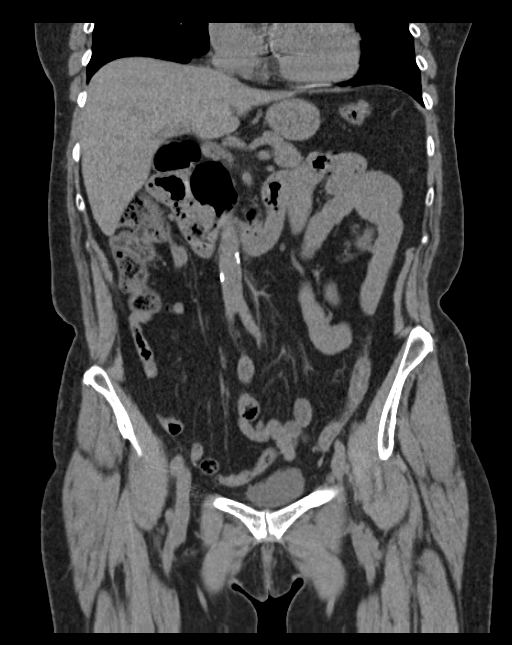
[im 53/118  soft-tissue]
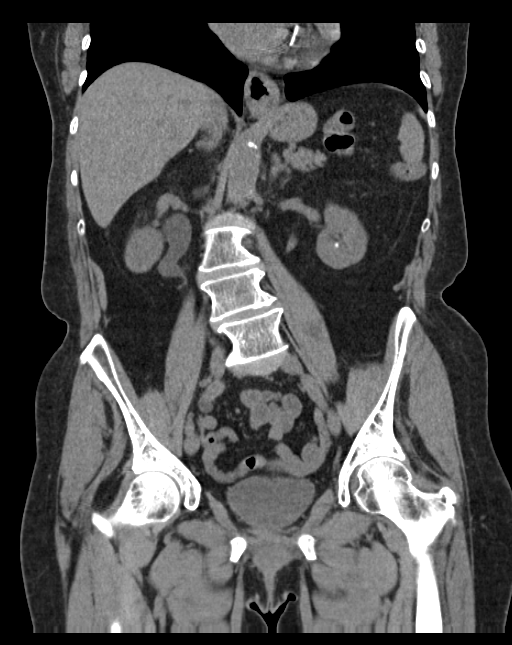
[im 66/118  soft-tissue]
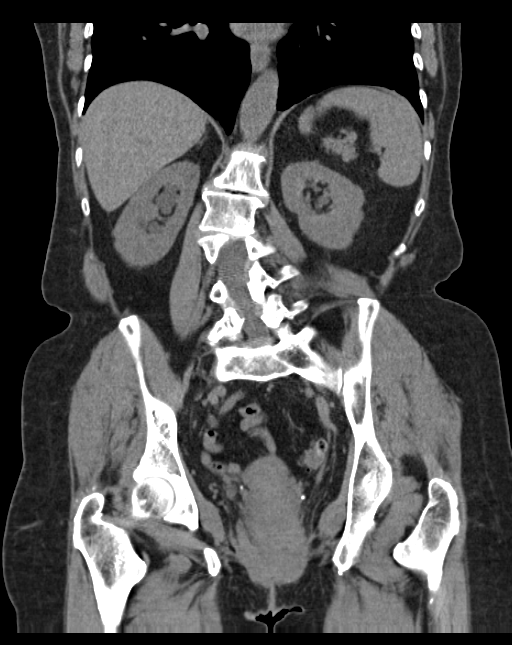

[Series 6: lung · axial · 0.74mm/px · z∈[+1275,+1371]mm · 13 of 56 slices shown, 15 images]
[im 4/56  soft-tissue]
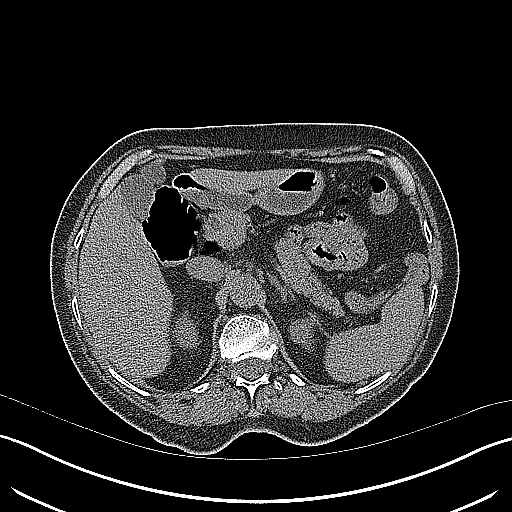
[im 4/56  bone]
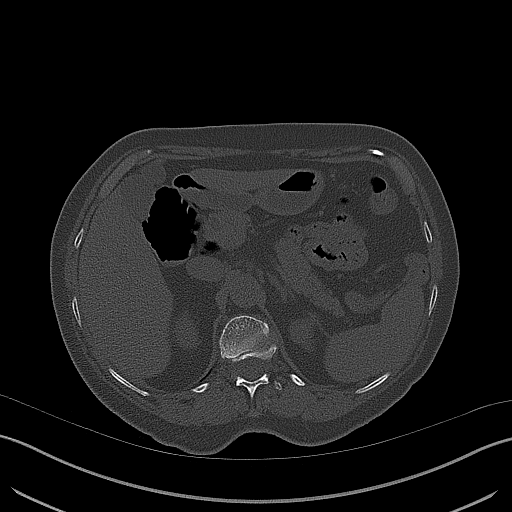
[im 8/56  soft-tissue]
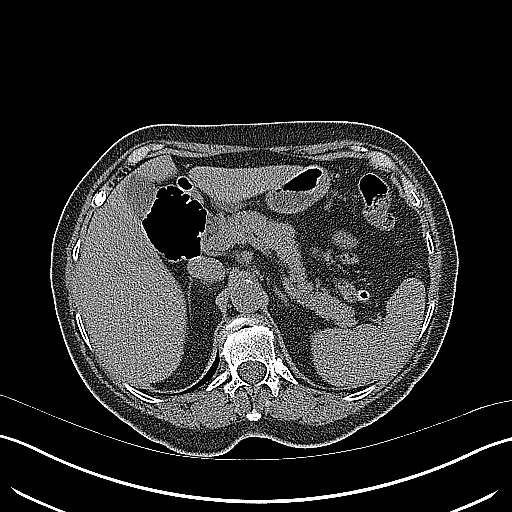
[im 11/56  soft-tissue]
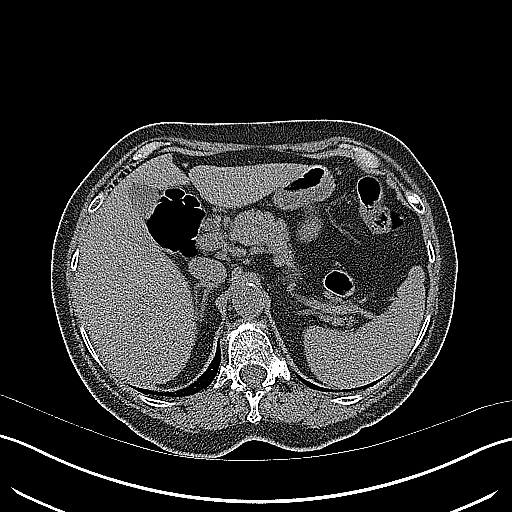
[im 16/56  soft-tissue]
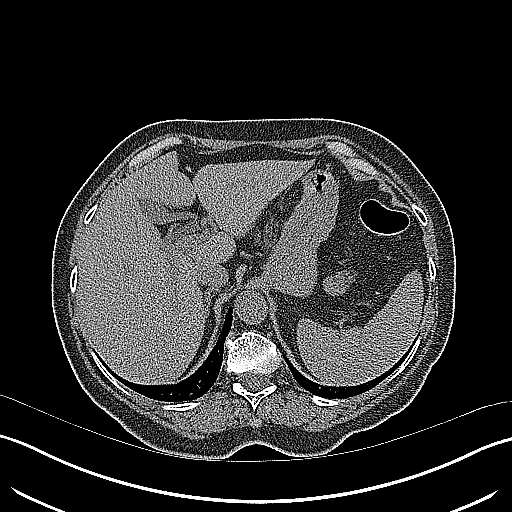
[im 20/56  soft-tissue]
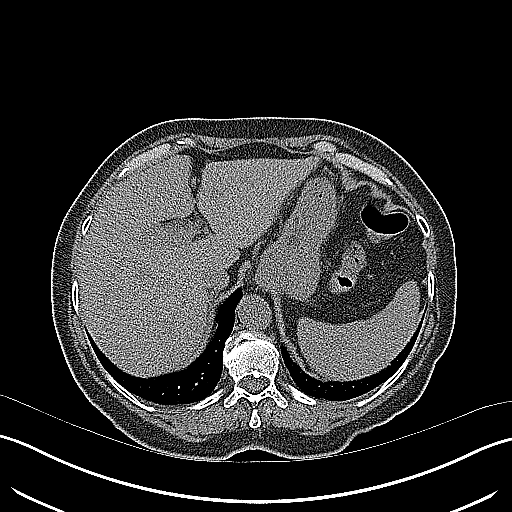
[im 24/56  soft-tissue]
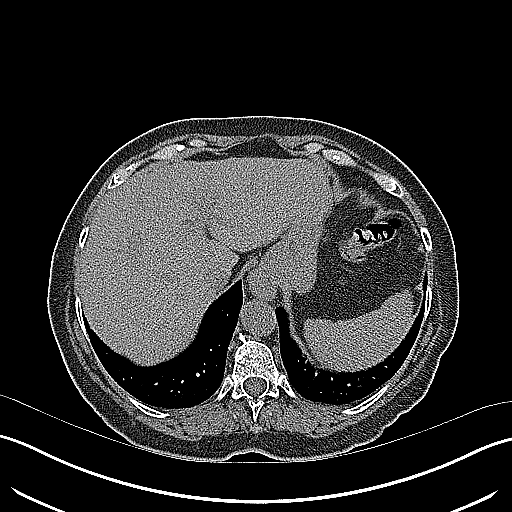
[im 29/56  soft-tissue]
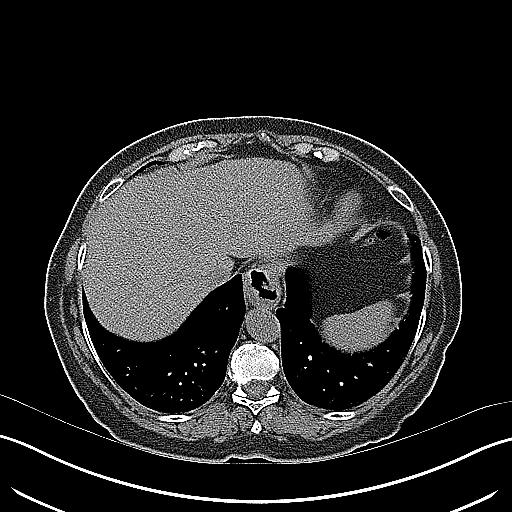
[im 32/56  soft-tissue]
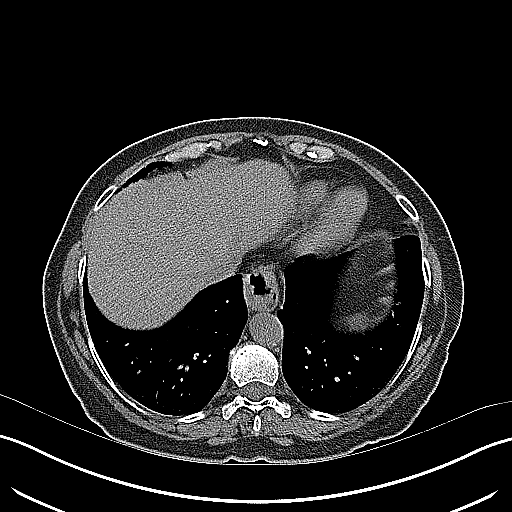
[im 36/56  soft-tissue]
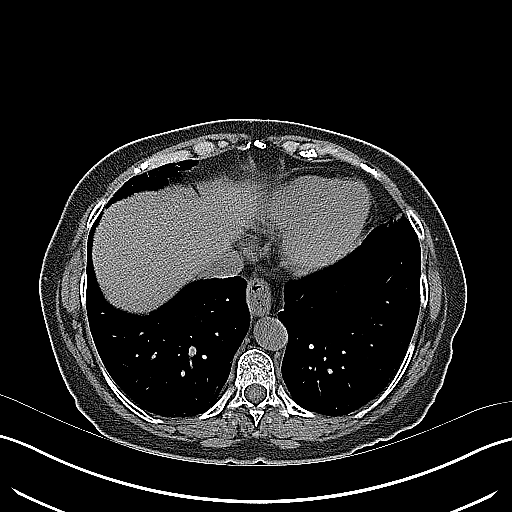
[im 36/56  bone]
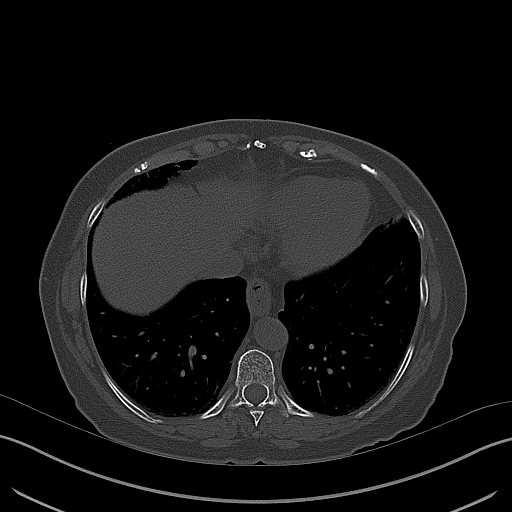
[im 40/56  soft-tissue]
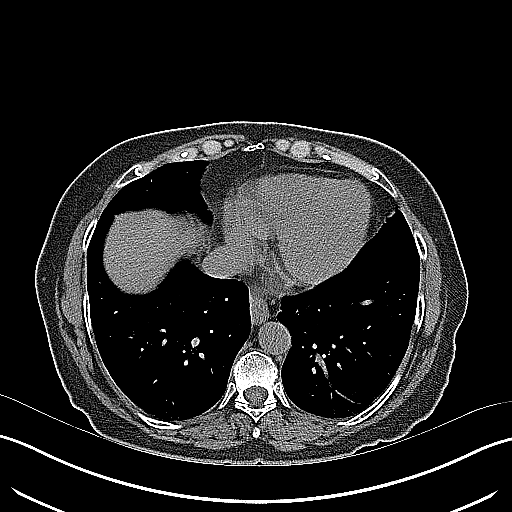
[im 45/56  soft-tissue]
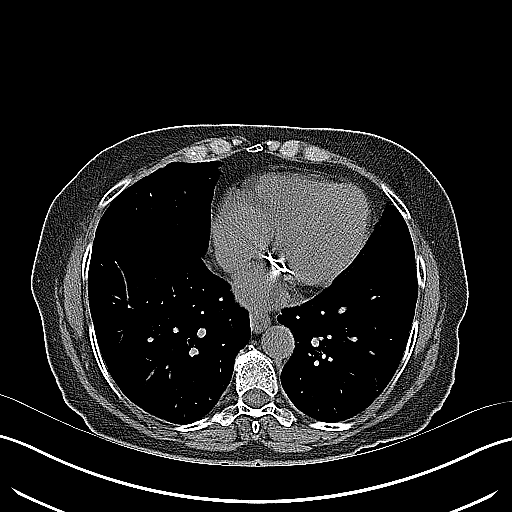
[im 48/56  soft-tissue]
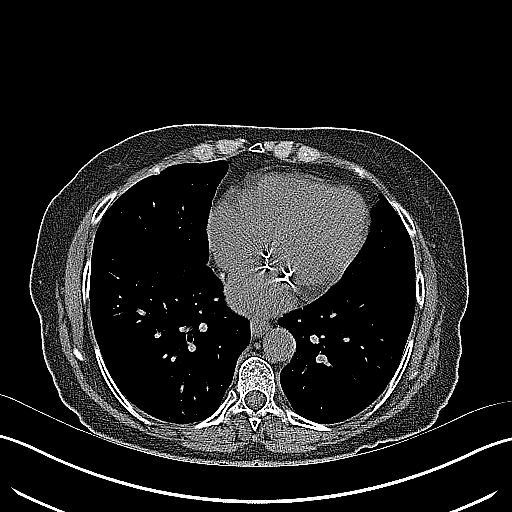
[im 52/56  soft-tissue]
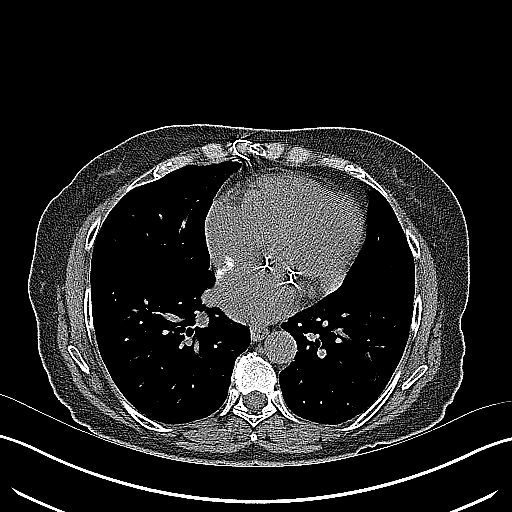

[16 of 46 positions shown; findings below may reference images not displayed]

FINDINGS: Lower chest: The included lung bases are clear. Small-moderate
hiatal hernia.

Liver: No focal lesion allowing for lack contrast.

Hepatobiliary: Gallbladder physiologically distended, no calcified
stone. No biliary dilatation.

Pancreas: No inflammation. The previously described ductal
prominence is not well visualized given lack of contrast.

Spleen: Normal.

Adrenal glands: No nodule.

Kidneys: Obstructing 5 mm stone at the right ureterovesicular
junction with moderate proximal hydroureteronephrosis. Mild
perinephric stranding. There is a punctate nonobstructing stone in
the lower right kidney. 5 mm nonobstructing stone in the interpolar
left kidney. No left hydronephrosis. Bilateral renal cysts are again
seen, not as well characterized currently given lack of contrast.

Stomach/Bowel: Stomach physiologically distended. There are no
dilated or thickened small bowel loops. Small volume of stool
throughout the colon without colonic wall thickening. Scattered
diverticulosis most prominent in the sigmoid colon without acute
diverticulitis. The appendix is normal.

Vascular/Lymphatic: No retroperitoneal adenopathy. Abdominal aorta
is normal in caliber. Mild atherosclerosis without aneurysm.

Reproductive: Uterus is normal for age.  No adnexal mass.

Bladder: Minimally distended.  No bladder stone or wall thickening.

Other: No free air, free fluid, or intra-abdominal fluid collection.
Tiny fat containing umbilical hernia per

Musculoskeletal: There are no acute or suspicious osseous
abnormalities. Scoliotic curvature of the spine and degenerative
change.
IMPRESSION: 1. Obstructing 5 mm stone at the right ureterovesicular junction
with moderate hydroureteronephrosis.
2. Additional nonobstructing stones in both kidneys.
3. Colonic diverticulosis without diverticulitis. Additional chronic
findings as described.

## 2017-02-22 ENCOUNTER — Ambulatory Visit (INDEPENDENT_AMBULATORY_CARE_PROVIDER_SITE_OTHER): Payer: Medicare Other | Admitting: Family Medicine

## 2017-02-22 ENCOUNTER — Encounter: Payer: Self-pay | Admitting: Family Medicine

## 2017-02-22 VITALS — BP 118/78 | HR 77 | Temp 97.6°F | Ht 62.0 in | Wt 129.8 lb

## 2017-02-22 DIAGNOSIS — R21 Rash and other nonspecific skin eruption: Secondary | ICD-10-CM | POA: Diagnosis not present

## 2017-02-22 NOTE — Assessment & Plan Note (Addendum)
Diffuse centrifugal papular rash without other symptoms of unclear etiology. Not consistent with tick borne illness, not consistent with viral exanthem, not consistent with contact dermatitis. ?reaction to recent shingrix. Discussed benefits/risks of 2nd shingrix shot. If she decides to receive, I did recommend full clearance of rash and waiting at least 4 months between immunizations.

## 2017-02-22 NOTE — Progress Notes (Signed)
   BP 118/78   Pulse 77   Temp 97.6 F (36.4 C)   Ht 5\' 2"  (1.575 m)   Wt 129 lb 12 oz (58.9 kg)   SpO2 99%   BMI 23.73 kg/m    CC: rash Subjective:    Patient ID: Angelica Rivers, female    DOB: 21-Jul-1946, 71 y.o.   MRN: [id number]  HPI: Angelica Rivers is a 71 y.o. female presenting on 02/22/2017 for Acute Visit (rash)   1 wk h/o diffuse bumpy rash throughout skin. Not tender or itchy.    She did get receive shingrix 1 month ago.  She did have tick bite to left inner leg >1 month ago. Attached < 24 hours.  No new lotions, detergents, soaps or shampoos.  No new medicines. No new foods, restaurants.   No other new exposures.   No recent URI, ST, congestion, coughing, abd pain, joint pains, headache, fevers, oral lesions.  No body aches, malaise.  No sick contacts at home.   Relevant past medical, surgical, family and social history reviewed and updated as indicated. Interim medical history since our last visit reviewed. Allergies and medications reviewed and updated. Outpatient Medications Prior to Visit  Medication Sig Dispense Refill  . aspirin 81 MG chewable tablet Chew 81 mg by mouth every other day.     . Cholecalciferol (VITAMIN D) 2000 UNITS CAPS Take 2 capsules by mouth daily.    . Flaxseed, Linseed, (FLAX SEED OIL) 1000 MG CAPS Take 1 capsule by mouth daily.     Marland Kitchen amoxicillin (AMOXIL) 500 MG capsule TAKE 4 CAPSULES BY MOUTH BEFORE DENTAL APPOINTMENT (Patient not taking: Reported on 10/29/2016) 4 capsule 5   Facility-Administered Medications Prior to Visit  Medication Dose Route Frequency Provider Last Rate Last Dose  . 0.9 %  sodium chloride infusion  500 mL Intravenous Continuous Milus Banister, MD         Per HPI unless specifically indicated in ROS section below Review of Systems     Objective:    BP 118/78   Pulse 77   Temp 97.6 F (36.4 C)   Ht 5\' 2"  (1.575 m)   Wt 129 lb 12 oz (58.9 kg)   SpO2 99%   BMI 23.73 kg/m   Wt Readings from Last 3  Encounters:  02/22/17 129 lb 12 oz (58.9 kg)  11/10/16 130 lb (59 kg)  10/29/16 130 lb (59 kg)    Physical Exam  Constitutional: She appears well-developed and well-nourished. No distress.  HENT:  Mouth/Throat: No oropharyngeal exudate.    Small pustule on R posterior hard palate   Skin: Skin is warm and dry. Rash noted.  Centrifugal faint papular rash that started on trunk and spread to arms, spares legs, face, palms and soles. No pain or itching or other symptoms  Nursing note and vitals reviewed.     Assessment & Plan:   Problem List Items Addressed This Visit    Skin rash - Primary    Diffuse centrifugal papular rash without other symptoms of unclear etiology. Not consistent with tick borne illness, not consistent with viral exanthem, not consistent with contact dermatitis. ?reaction to recent shingrix. Discussed benefits/risks of 2nd shingrix shot. If she decides to receive, I did recommend full clearance of rash and waiting at least 4 months between immunizations.           Follow up plan: Return if symptoms worsen or fail to improve.  Ria Bush, MD

## 2017-02-22 NOTE — Patient Instructions (Addendum)
You do have a rash, unclear cause. Possible shingrix reaction.  If spreading let us know. If new symptoms like fever >101 or respiratory symptoms, feel rundown, or new abdominal pain or joint pains or headaches, let us know right away.  As far as second shingles shot - would wait full 4 months between shots, would ensure rash fully resolved prior to shot. May even consider avoiding 2nd series shot.

## 2017-05-23 DIAGNOSIS — Z1231 Encounter for screening mammogram for malignant neoplasm of breast: Secondary | ICD-10-CM | POA: Diagnosis not present

## 2017-05-25 ENCOUNTER — Encounter: Payer: Self-pay | Admitting: Family Medicine

## 2017-06-19 DIAGNOSIS — S6992XA Unspecified injury of left wrist, hand and finger(s), initial encounter: Secondary | ICD-10-CM | POA: Diagnosis not present

## 2017-06-27 DIAGNOSIS — S6992XS Unspecified injury of left wrist, hand and finger(s), sequela: Secondary | ICD-10-CM | POA: Diagnosis not present

## 2017-06-27 DIAGNOSIS — M79641 Pain in right hand: Secondary | ICD-10-CM | POA: Diagnosis not present

## 2017-07-19 DIAGNOSIS — H40003 Preglaucoma, unspecified, bilateral: Secondary | ICD-10-CM | POA: Diagnosis not present

## 2017-09-25 ENCOUNTER — Telehealth: Payer: Self-pay | Admitting: Family Medicine

## 2017-09-25 DIAGNOSIS — D696 Thrombocytopenia, unspecified: Secondary | ICD-10-CM

## 2017-09-25 DIAGNOSIS — Z0000 Encounter for general adult medical examination without abnormal findings: Secondary | ICD-10-CM

## 2017-09-25 DIAGNOSIS — E7800 Pure hypercholesterolemia, unspecified: Secondary | ICD-10-CM

## 2017-09-25 DIAGNOSIS — E559 Vitamin D deficiency, unspecified: Secondary | ICD-10-CM

## 2017-09-25 NOTE — Telephone Encounter (Signed)
-----   Message from Eustace Pen, LPN sent at 8/67/6195 12:33 PM EST ----- Regarding: Labs 1/29 Lab orders needed. Thank you.  Insurance:  [facility] Medicare

## 2017-09-27 ENCOUNTER — Ambulatory Visit (INDEPENDENT_AMBULATORY_CARE_PROVIDER_SITE_OTHER): Payer: Medicare Other

## 2017-09-27 VITALS — BP 100/80 | HR 66 | Temp 97.8°F | Ht 61.5 in | Wt 124.2 lb

## 2017-09-27 DIAGNOSIS — D696 Thrombocytopenia, unspecified: Secondary | ICD-10-CM | POA: Diagnosis not present

## 2017-09-27 DIAGNOSIS — Z0000 Encounter for general adult medical examination without abnormal findings: Secondary | ICD-10-CM | POA: Diagnosis not present

## 2017-09-27 DIAGNOSIS — E7800 Pure hypercholesterolemia, unspecified: Secondary | ICD-10-CM

## 2017-09-27 DIAGNOSIS — E559 Vitamin D deficiency, unspecified: Secondary | ICD-10-CM | POA: Diagnosis not present

## 2017-09-27 LAB — LIPID PANEL
CHOL/HDL RATIO: 3
Cholesterol: 189 mg/dL (ref 0–200)
HDL: 68.2 mg/dL (ref >39.00)
LDL CALC: 106 mg/dL — AB (ref 0–99)
NONHDL: 120.77
Triglycerides: 74 mg/dL (ref 0.0–149.0)
VLDL: 14.8 mg/dL (ref 0.0–40.0)

## 2017-09-27 LAB — COMPREHENSIVE METABOLIC PANEL
ALT: 14 U/L (ref 0–35)
AST: 19 U/L (ref 0–37)
Albumin: 4.4 g/dL (ref 3.5–5.2)
Alkaline Phosphatase: 52 U/L (ref 39–117)
BUN: 18 mg/dL (ref 6–23)
CO2: 29 meq/L (ref 19–32)
Calcium: 9.7 mg/dL (ref 8.4–10.5)
Chloride: 106 mEq/L (ref 96–112)
Creatinine, Ser: 1.02 mg/dL (ref 0.40–1.20)
GFR: 56.71 mL/min — AB (ref >60.00)
GLUCOSE: 85 mg/dL (ref 70–99)
POTASSIUM: 5.1 meq/L (ref 3.5–5.1)
Sodium: 141 mEq/L (ref 135–145)
Total Bilirubin: 0.4 mg/dL (ref 0.2–1.2)
Total Protein: 7.2 g/dL (ref 6.0–8.3)

## 2017-09-27 LAB — CBC WITH DIFFERENTIAL/PLATELET
Basophils Absolute: 0 10*3/uL (ref 0.0–0.1)
Basophils Relative: 0.4 % (ref 0.0–3.0)
EOS PCT: 4 % (ref 0.0–5.0)
Eosinophils Absolute: 0.2 10*3/uL (ref 0.0–0.7)
HEMATOCRIT: 41.7 % (ref 36.0–46.0)
Hemoglobin: 13.4 g/dL (ref 12.0–15.0)
LYMPHS ABS: 1.7 10*3/uL (ref 0.7–4.0)
Lymphocytes Relative: 31.4 % (ref 12.0–46.0)
MCHC: 32.2 g/dL (ref 30.0–36.0)
MCV: 89.3 fl (ref 78.0–100.0)
MONOS PCT: 8.9 % (ref 3.0–12.0)
Monocytes Absolute: 0.5 10*3/uL (ref 0.1–1.0)
NEUTROS ABS: 2.9 10*3/uL (ref 1.4–7.7)
Neutrophils Relative %: 55.3 % (ref 43.0–77.0)
PLATELETS: 140 10*3/uL — AB (ref 150.0–400.0)
RBC: 4.67 Mil/uL (ref 3.87–5.11)
RDW: 13.6 % (ref 11.5–15.5)
WBC: 5.3 10*3/uL (ref 4.0–10.5)

## 2017-09-27 LAB — TSH: TSH: 1.37 u[IU]/mL (ref 0.35–4.50)

## 2017-09-27 LAB — VITAMIN D 25 HYDROXY (VIT D DEFICIENCY, FRACTURES): VITD: 74.8 ng/mL (ref 30.00–100.00)

## 2017-09-27 NOTE — Progress Notes (Signed)
Subjective:   Angelica Rivers is a 72 y.o. female who presents for Medicare Annual (Subsequent) preventive examination.  Review of Systems:  N/A Cardiac Risk Factors include: advanced age (>64men, >4 women);dyslipidemia     Objective:     Vitals: BP 100/80 (BP Location: Right Arm, Patient Position: Sitting, Cuff Size: Normal)   Pulse 66   Temp 97.8 F (36.6 C) (Oral)   Ht 5' 1.5" (1.562 m) Comment: no shoes  Wt 124 lb 4 oz (56.4 kg)   SpO2 98%   BMI 23.10 kg/m   Body mass index is 23.1 kg/m.  Advanced Directives 09/27/2017 11/10/2016 09/23/2016 01/24/2016  Does Patient Have a Medical Advance Directive? No No No No  Would patient like information on creating a medical advance directive? No - Patient declined - - No - patient declined information    Tobacco Social History   Tobacco Use  Smoking Status Never Smoker  Smokeless Tobacco Never Used     Counseling given: No   Clinical Intake:  Pre-visit preparation completed: Yes  Pain : No/denies pain Pain Score: 0-No pain     Nutritional Status: BMI of 19-24  Normal Nutritional Risks: None Diabetes: No  How often do you need to have someone help you when you read instructions, pamphlets, or other written materials from your doctor or pharmacy?: 1 - Never What is the last grade level you completed in school?: 12th grade  Interpreter Needed?: No  Comments: pt lives with spouse Information entered by :: LPinson, LPN  Past Medical History:  Diagnosis Date  . Anemia   . History of repair of mitral valve    proph for dentist  . Nephrolithiasis    Hx of  . Osteopenia   . Osteoporosis    fosamax for over 5 yrs.  . Vaginal cyst    stable   Past Surgical History:  Procedure Laterality Date  . CARDIAC SURGERY  1995   mitral valve repair carpenters ring  . COLONOSCOPY    . HEMORRHOID SURGERY    . lithotripsy for kidney stones    . MITRAL VALVE REPAIR  2004  . ruptured ovarian cyst surgery    . skin  graft dog bite  2010  . TONSILLECTOMY     Family History  Problem Relation Age of Onset  . Colon cancer Mother   . Diabetes Brother   . Esophageal cancer Neg Hx   . Rectal cancer Neg Hx   . Stomach cancer Neg Hx    Social History   Socioeconomic History  . Marital status: Married    Spouse name: None  . Number of children: None  . Years of education: None  . Highest education level: None  Social Needs  . Financial resource strain: None  . Food insecurity - worry: None  . Food insecurity - inability: None  . Transportation needs - medical: None  . Transportation needs - non-medical: None  Occupational History  . None  Tobacco Use  . Smoking status: Never Smoker  . Smokeless tobacco: Never Used  Substance and Sexual Activity  . Alcohol use: No    Alcohol/week: 0.0 oz  . Drug use: No  . Sexual activity: No  Other Topics Concern  . None  Social History Narrative  . None    Outpatient Encounter Medications as of 09/27/2017  Medication Sig  . aspirin 81 MG chewable tablet Chew 81 mg by mouth every other day.   . Cholecalciferol (VITAMIN D) 2000 UNITS  CAPS Take 2 capsules by mouth daily.  . Flaxseed, Linseed, (FLAX SEED OIL) 1000 MG CAPS Take 1 capsule by mouth daily.    Facility-Administered Encounter Medications as of 09/27/2017  Medication  . 0.9 %  sodium chloride infusion    Activities of Daily Living In your present state of health, do you have any difficulty performing the following activities: 09/27/2017  Hearing? N  Vision? N  Difficulty concentrating or making decisions? N  Walking or climbing stairs? N  Dressing or bathing? N  Doing errands, shopping? N  Preparing Food and eating ? N  Using the Toilet? N  In the past six months, have you accidently leaked urine? N  Do you have problems with loss of bowel control? N  Managing your Medications? N  Managing your Finances? N  Housekeeping or managing your Housekeeping? N  Some recent data might be  hidden    Patient Care Team: Tower, Wynelle Fanny, MD as PCP - General    Assessment:   This is a routine wellness examination for Angelica Rivers.   Hearing Screening   125Hz  250Hz  500Hz  1000Hz  2000Hz  3000Hz  4000Hz  6000Hz  8000Hz   Right ear:   40 40 40  0    Left ear:   40 40 40  0    Vision Screening Comments: Last vision exam in Winter 2018 @ [facility] and Dietary recommendations Current Exercise Habits: Home exercise routine, Type of exercise: walking, Time (Minutes): 60, Frequency (Times/Week): 7, Weekly Exercise (Minutes/Week): 420, Intensity: Mild, Exercise limited by: None identified  Goals    . DIET - INCREASE WATER INTAKE     Starting 09/27/2017, I will continue to drink at least 6-8 glasses of water daily.        Fall Risk Fall Risk  09/27/2017 09/23/2016 09/20/2016 09/15/2015 12/17/2013  Falls in the past year? Yes No Yes No No  Comment pt fell over cord in yard; caused sprain to left thumb - - - -  Number falls in past yr: 1 - - - -  Injury with Fall? Yes - - - -   Depression Screen PHQ 2/9 Scores 09/27/2017 09/23/2016 09/20/2016 09/15/2015  PHQ - 2 Score 0 0 0 0  PHQ- 9 Score 0 - - -     Cognitive Function MMSE - Mini Mental State Exam 09/27/2017 09/23/2016  Orientation to time 5 5  Orientation to Place 5 5  Registration 3 3  Attention/ Calculation 0 0  Recall 3 3  Language- name 2 objects 0 0  Language- repeat 1 1  Language- follow 3 step command 3 3  Language- read & follow direction 0 0  Write a sentence 0 0  Copy design 0 0  Total score 20 20     PLEASE NOTE: A Mini-Cog screen was completed. Maximum score is 20. A value of 0 denotes this part of Folstein MMSE was not completed or the patient failed this part of the Mini-Cog screening.   Mini-Cog Screening Orientation to Time - Max 5 pts Orientation to Place - Max 5 pts Registration - Max 3 pts Recall - Max 3 pts Language Repeat - Max 1 pts Language Follow 3 Step Command - Max 3 pts       Immunization History  Administered Date(s) Administered  . 19-influenza Whole 06/14/2015  . Influenza Whole 09/08/2006  . Influenza, High Dose Seasonal PF 06/21/2014, 06/23/2016  . Influenza-Unspecified 05/30/2013, 06/14/2017  . Pneumococcal Conjugate-13 12/17/2013  . Pneumococcal  Polysaccharide-23 10/13/2011  . Td 04/20/2002, 02/27/2009  . Tdap 12/25/2012, 03/06/2014  . Zoster 01/24/2013  . Zoster Recombinat (Shingrix) 01/05/2017    Screening Tests Health Maintenance  Topic Date Due  . PAP SMEAR  09/27/2048 (Originally 10/12/2012)  . MAMMOGRAM  05/23/2018  . COLONOSCOPY  11/10/2021  . TETANUS/TDAP  03/06/2024  . INFLUENZA VACCINE  Completed  . DEXA SCAN  Completed  . Hepatitis C Screening  Completed  . PNA vac Low Risk Adult  Completed      Plan:     I have personally reviewed, addressed, and noted the following in the patient's chart:  A. Medical and social history B. Use of alcohol, tobacco or illicit drugs  C. Current medications and supplements D. Functional ability and status E.  Nutritional status F.  Physical activity G. Advance directives H. List of other physicians I.  Hospitalizations, surgeries, and ER visits in previous 12 months J.  [place] to include hearing, vision, cognitive, depression L. Referrals and appointments - none  In addition, I have reviewed and discussed with patient certain preventive protocols, quality metrics, and best practice recommendations. A written personalized care plan for preventive services as well as general preventive health recommendations were provided to patient.  See attached scanned questionnaire for additional information.   Signed,   Lindell Noe, MHA, BS, LPN Health Coach   Lindell Noe, [place]  [birth year]

## 2017-09-27 NOTE — Patient Instructions (Signed)
Angelica Rivers , Thank you for taking time to come for your Medicare Wellness Visit. I appreciate your ongoing commitment to your health goals. Please review the following plan we discussed and let me know if I can assist you in the future.   These are the goals we discussed: Goals    . DIET - INCREASE WATER INTAKE     Starting 09/27/2017, I will continue to drink at least 6-8 glasses of water daily.        This is a list of the screening recommended for you and due dates:  Health Maintenance  Topic Date Due  . Pap Smear  09/27/2048*  . Mammogram  05/23/2018  . Colon Cancer Screening  11/10/2021  . Tetanus Vaccine  03/06/2024  . Flu Shot  Completed  . DEXA scan (bone density measurement)  Completed  .  Hepatitis C: One time screening is recommended by Center for Disease Control  (CDC) for  adults born from [birth year] through 1965.   Completed  . Pneumonia vaccines  Completed  *Topic was postponed. The date shown is not the original due date.   Preventive Care for Adults  A healthy lifestyle and preventive care can promote health and wellness. Preventive health guidelines for adults include the following key practices.  . A routine yearly physical is a good way to check with your health care provider about your health and preventive screening. It is a chance to share any concerns and updates on your health and to receive a thorough exam.  . Visit your dentist for a routine exam and preventive care every 6 months. Brush your teeth twice a day and floss once a day. Good oral hygiene prevents tooth decay and gum disease.  . The frequency of eye exams is based on your age, health, family medical history, use  of contact lenses, and other factors. Follow your health care provider's recommendations for frequency of eye exams.  . Eat a healthy diet. Foods like vegetables, fruits, whole grains, low-fat dairy products, and lean protein foods contain the nutrients you need without too many calories.  Decrease your intake of foods high in solid fats, added sugars, and salt. Eat the right amount of calories for you. Get information about a proper diet from your health care provider, if necessary.  . Regular physical exercise is one of the most important things you can do for your health. Most adults should get at least 150 minutes of moderate-intensity exercise (any activity that increases your heart rate and causes you to sweat) each week. In addition, most adults need muscle-strengthening exercises on 2 or more days a week.  Silver Sneakers may be a benefit available to you. To determine eligibility, you may visit the website: www.silversneakers.com or contact program at (713)684-6750 Mon-Fri between 8AM-8PM.   . Maintain a healthy weight. The body mass index (BMI) is a screening tool to identify possible weight problems. It provides an estimate of body fat based on height and weight. Your health care provider can find your BMI and can help you achieve or maintain a healthy weight.   For adults 20 years and older: ? A BMI below 18.5 is considered underweight. ? A BMI of 18.5 to 24.9 is normal. ? A BMI of 25 to 29.9 is considered overweight. ? A BMI of 30 and above is considered obese.   . Maintain normal blood lipids and cholesterol levels by exercising and minimizing your intake of saturated fat. Eat a balanced diet with plenty  of fruit and vegetables. Blood tests for lipids and cholesterol should begin at age 43 and be repeated every 5 years. If your lipid or cholesterol levels are high, you are over 50, or you are at high risk for heart disease, you may need your cholesterol levels checked more frequently. Ongoing high lipid and cholesterol levels should be treated with medicines if diet and exercise are not working.  . If you smoke, find out from your health care provider how to quit. If you do not use tobacco, please do not start.  . If you choose to drink alcohol, please do not consume  more than 2 drinks per day. One drink is considered to be 12 ounces (355 mL) of beer, 5 ounces (148 mL) of wine, or 1.5 ounces (44 mL) of liquor.  . If you are 32-47 years old, ask your health care provider if you should take aspirin to prevent strokes.  . Use sunscreen. Apply sunscreen liberally and repeatedly throughout the day. You should seek shade when your shadow is shorter than you. Protect yourself by wearing long sleeves, pants, a wide-brimmed hat, and sunglasses year round, whenever you are outdoors.  . Once a month, do a whole body skin exam, using a mirror to look at the skin on your back. Tell your health care provider of new moles, moles that have irregular borders, moles that are larger than a pencil eraser, or moles that have changed in shape or color.

## 2017-09-27 NOTE — Progress Notes (Signed)
PCP notes:   Health maintenance:  No gaps identified.  Abnormal screenings:   Fall risk - hx of fall with injury and medical treatment  Hearing - failed.  Hearing Screening   125Hz  250Hz  500Hz  1000Hz  2000Hz  3000Hz  4000Hz  6000Hz  8000Hz   Right ear:   40 40 40  0    Left ear:   40 40 40  0     Patient concerns:   None    Nurse concerns:  None  Next PCP appt:   10/04/17 @ 1430  I reviewed health advisor's note, was available for consultation, and agree with documentation and plan. Loura Pardon MD

## 2017-09-27 NOTE — Progress Notes (Signed)
Pre visit review using our clinic review tool, if applicable. No additional management support is needed unless otherwise documented below in the visit note. 

## 2017-10-04 ENCOUNTER — Ambulatory Visit (INDEPENDENT_AMBULATORY_CARE_PROVIDER_SITE_OTHER): Payer: Medicare Other | Admitting: Family Medicine

## 2017-10-04 ENCOUNTER — Encounter: Payer: Self-pay | Admitting: Family Medicine

## 2017-10-04 VITALS — BP 122/66 | HR 83 | Temp 98.0°F | Ht 61.5 in | Wt 126.0 lb

## 2017-10-04 DIAGNOSIS — E2839 Other primary ovarian failure: Secondary | ICD-10-CM

## 2017-10-04 DIAGNOSIS — D696 Thrombocytopenia, unspecified: Secondary | ICD-10-CM

## 2017-10-04 DIAGNOSIS — E7800 Pure hypercholesterolemia, unspecified: Secondary | ICD-10-CM | POA: Diagnosis not present

## 2017-10-04 DIAGNOSIS — E559 Vitamin D deficiency, unspecified: Secondary | ICD-10-CM | POA: Diagnosis not present

## 2017-10-04 DIAGNOSIS — Z0000 Encounter for general adult medical examination without abnormal findings: Secondary | ICD-10-CM

## 2017-10-04 DIAGNOSIS — M8589 Other specified disorders of bone density and structure, multiple sites: Secondary | ICD-10-CM | POA: Diagnosis not present

## 2017-10-04 DIAGNOSIS — Z1211 Encounter for screening for malignant neoplasm of colon: Secondary | ICD-10-CM | POA: Diagnosis not present

## 2017-10-04 NOTE — Progress Notes (Signed)
Subjective:    Patient ID: Angelica Rivers, female    DOB: 28-Feb-1946, 72 y.o.   MRN: [id number]  HPI  Here for health maintenance exam and to review chronic medical problems    Caring for mother  Also has a new grandchild 6 months  Feeling good !  Trying to take care of herself    Had amw on 1/29 Missed 4000 Hz hearing screen both ears -does not notice it   Hx of a fall - tripped over an inflatable ornament in yard  - hurt her L thumb (no fx) She tries to be more careful not to fall (now things are better lit and marked)   Wt Readings from Last 3 Encounters:  10/04/17 126 lb (57.2 kg)  09/27/17 124 lb 4 oz (56.4 kg)  02/22/17 129 lb 12 oz (58.9 kg)  very good weight  She eats healthy  Very active  She runs the stairs at home and also walks with the dog  23.42 kg/m    Mammogram 9/18-normal  Self breast exam -no lumps   Has a vaginal cyst-no change or problems  No bigger  No vaginal bleeding /pain or pelvic symptoms  Colonoscopy 3/18 - one polyp -will re check in 5 years  Mother had colon cancer   dexa 1/17 osteopenia  She got a reminder to schedule it  D level is 74.8- excellent  Takes her ca and D Walking and stairs for exercise   utd imms   shingrix 5/18- got the first one / she had a rash over arms and chest after it  Did not get the 2nd one    Blood pressure BP Readings from Last 3 Encounters:  10/04/17 122/66  09/27/17 100/80  02/22/17 118/78    Hyperlipidemia Lab Results  Component Value Date   CHOL 189 09/27/2017   CHOL 183 09/13/2016   CHOL 185 09/12/2015   Lab Results  Component Value Date   HDL 68.20 09/27/2017   HDL 69.00 09/13/2016   HDL 62.00 09/12/2015   Lab Results  Component Value Date   LDLCALC 106 (H) 09/27/2017   LDLCALC 102 (H) 09/13/2016   LDLCALC 110 (H) 09/12/2015   Lab Results  Component Value Date   TRIG 74.0 09/27/2017   TRIG 63.0 09/13/2016   TRIG 66.0 09/12/2015   Lab Results  Component Value Date   CHOLHDL 3 09/27/2017   CHOLHDL 3 09/13/2016   CHOLHDL 3 09/12/2015   Lab Results  Component Value Date   LDLDIRECT 117.7 10/06/2011   LDLDIRECT 127.8 09/22/2010   LDLDIRECT 120.8 09/12/2008   Lab Results  Component Value Date   CREATININE 1.02 09/27/2017   BUN 18 09/27/2017   NA 141 09/27/2017   K 5.1 09/27/2017   CL 106 09/27/2017   CO2 29 09/27/2017   Lab Results  Component Value Date   ALT 14 09/27/2017   AST 19 09/27/2017   ALKPHOS 52 09/27/2017   BILITOT 0.4 09/27/2017    Lab Results  Component Value Date   TSH 1.37 09/27/2017   glucose of 85   Hx of low platelets Lab Results  Component Value Date   WBC 5.3 09/27/2017   HGB 13.4 09/27/2017   HCT 41.7 09/27/2017   MCV 89.3 09/27/2017   PLT 140.0 (L) 09/27/2017   No bruising or bleeding   Patient Active Problem List   Diagnosis Date Noted  . Skin rash 02/22/2017  . Anemia 09/20/2016  . Estrogen deficiency 09/15/2015  .  Colon cancer screening 12/17/2013  . Encounter for Medicare annual wellness exam 12/08/2013  . Routine general medical examination at a health care facility 12/16/2012  . Gynecological examination 10/13/2011  . Vitamin D deficiency 09/23/2009  . Thrombocytopenia ([place]) 09/23/2009  . Hyperlipidemia 09/12/2008  . FIBROCYSTIC BREAST DISEASE 09/08/2007  . Osteopenia 09/08/2007  . NEPHROLITHIASIS, HX OF 09/08/2007   Past Medical History:  Diagnosis Date  . Anemia   . History of repair of mitral valve    proph for dentist  . Nephrolithiasis    Hx of  . Osteopenia   . Osteoporosis    fosamax for over 5 yrs.  . Vaginal cyst    stable   Past Surgical History:  Procedure Laterality Date  . CARDIAC SURGERY  1995   mitral valve repair carpenters ring  . COLONOSCOPY    . HEMORRHOID SURGERY    . lithotripsy for kidney stones    . MITRAL VALVE REPAIR  2004  . ruptured ovarian cyst surgery    . skin graft dog bite  2010  . TONSILLECTOMY     Social History   Tobacco Use  . Smoking  status: Never Smoker  . Smokeless tobacco: Never Used  Substance Use Topics  . Alcohol use: No    Alcohol/week: 0.0 oz  . Drug use: No   Family History  Problem Relation Age of Onset  . Colon cancer Mother   . Diabetes Brother   . Esophageal cancer Neg Hx   . Rectal cancer Neg Hx   . Stomach cancer Neg Hx    Allergies  Allergen Reactions  . Neosporin [Neomycin-Bacitracin Zn-Polymyx] Other (See Comments)    Festers up  . Shingrix [Zoster Vac Recomb Adjuvanted]     Rash    Current Outpatient Medications on File Prior to Visit  Medication Sig Dispense Refill  . aspirin 81 MG chewable tablet Chew 81 mg by mouth every other day.     . Cholecalciferol (VITAMIN D) 2000 UNITS CAPS Take 2 capsules by mouth daily.    . Flaxseed, Linseed, (FLAX SEED OIL) 1000 MG CAPS Take 1 capsule by mouth daily.      No current facility-administered medications on file prior to visit.      Review of Systems  Constitutional: Negative for activity change, appetite change, fatigue, fever and unexpected weight change.  HENT: Negative for congestion, ear pain, rhinorrhea, sinus pressure and sore throat.   Eyes: Negative for pain, redness and visual disturbance.  Respiratory: Negative for cough, shortness of breath and wheezing.   Cardiovascular: Negative for chest pain and palpitations.  Gastrointestinal: Negative for abdominal pain, blood in stool, constipation and diarrhea.  Endocrine: Negative for polydipsia and polyuria.  Genitourinary: Negative for dysuria, frequency and urgency.  Musculoskeletal: Negative for arthralgias, back pain and myalgias.  Skin: Negative for pallor and rash.  Allergic/Immunologic: Negative for environmental allergies.  Neurological: Negative for dizziness, syncope and headaches.  Hematological: Negative for adenopathy. Does not bruise/bleed easily.  Psychiatric/Behavioral: Negative for decreased concentration and dysphoric mood. The patient is not nervous/anxious.         Objective:   Physical Exam  Constitutional: She appears well-developed and well-nourished. No distress.  Well appearing   HENT:  Head: Normocephalic and atraumatic.  Right Ear: External ear normal.  Left Ear: External ear normal.  Mouth/Throat: Oropharynx is clear and moist.  Eyes: Conjunctivae and EOM are normal. Pupils are equal, round, and reactive to light. No scleral icterus.  Neck: Normal range of  motion. Neck supple. No JVD present. Carotid bruit is not present. No thyromegaly present.  Cardiovascular: Normal rate, regular rhythm, normal heart sounds and intact distal pulses. Exam reveals no gallop.  Pulmonary/Chest: Effort normal and breath sounds normal. No respiratory distress. She has no wheezes. She exhibits no tenderness.  Abdominal: Soft. Bowel sounds are normal. She exhibits no distension, no abdominal bruit and no mass. There is no tenderness.  Genitourinary: No breast swelling, tenderness, discharge or bleeding.  Genitourinary Comments: Breast exam: No mass, nodules, thickening, tenderness, bulging, retraction, inflamation, nipple discharge or skin changes noted.  No axillary or clavicular LA.      Musculoskeletal: Normal range of motion. She exhibits no edema or tenderness.  No kyphosis   Lymphadenopathy:    She has no cervical adenopathy.  Neurological: She is alert. She has normal reflexes. No cranial nerve deficit. She exhibits normal muscle tone. Coordination normal.  Skin: Skin is warm and dry. No rash noted. No erythema. No pallor.  Solar lentigines diffusely  Few sks  Psychiatric: She has a normal mood and affect.  cheerful          Assessment & Plan:   Problem List Items Addressed This Visit      Musculoskeletal and Integument   Osteopenia    Due for dexa  Disc need for calcium/ vitamin D/ wt bearing exercise and bone density test every 2 y to monitor Disc safety/ fracture risk in detail   No falls or fx        Other   Colon cancer screening      utd colonoscopy 3/18 5 y recall  fam hx of colon cancer       Estrogen deficiency   Relevant Orders   DG Bone Density   Hyperlipidemia    Disc goals for lipids and reasons to control them Rev labs with pt Rev low sat fat diet in detail  Well controlled with diet      Routine general medical examination at a health care facility - Primary    Reviewed health habits including diet and exercise and skin cancer prevention Reviewed appropriate screening tests for age  Also reviewed health mt list, fam hx and immunization status , as well as social and family history   See HPI amw rev Labs rev Ref for dexa (2 y)  Good health habits       Thrombocytopenia (HCC)    Mild and stable No symptoms Continue to follow      Vitamin D deficiency    Vitamin D level is therapeutic with current supplementation Disc importance of this to bone and overall health

## 2017-10-04 NOTE — Patient Instructions (Addendum)
We will do your dexa referral -at check out   Keep taking good care of yourself  Stay active -physically and mentally   Labs look good

## 2017-10-05 DIAGNOSIS — H40003 Preglaucoma, unspecified, bilateral: Secondary | ICD-10-CM | POA: Diagnosis not present

## 2017-10-05 NOTE — Assessment & Plan Note (Signed)
utd colonoscopy 3/18 5 y recall  fam hx of colon cancer

## 2017-10-05 NOTE — Assessment & Plan Note (Signed)
Vitamin D level is therapeutic with current supplementation Disc importance of this to bone and overall health  

## 2017-10-05 NOTE — Assessment & Plan Note (Signed)
Mild and stable No symptoms Continue to follow

## 2017-10-05 NOTE — Assessment & Plan Note (Signed)
Due for dexa  Disc need for calcium/ vitamin D/ wt bearing exercise and bone density test every 2 y to monitor Disc safety/ fracture risk in detail   No falls or fx

## 2017-10-05 NOTE — Assessment & Plan Note (Signed)
Reviewed health habits including diet and exercise and skin cancer prevention Reviewed appropriate screening tests for age  Also reviewed health mt list, fam hx and immunization status , as well as social and family history   See HPI amw rev Labs rev Ref for dexa (2 y)  Good health habits

## 2017-10-05 NOTE — Assessment & Plan Note (Signed)
Disc goals for lipids and reasons to control them Rev labs with pt Rev low sat fat diet in detail  Well controlled with diet

## 2017-11-07 DIAGNOSIS — H40003 Preglaucoma, unspecified, bilateral: Secondary | ICD-10-CM | POA: Diagnosis not present

## 2017-11-15 ENCOUNTER — Other Ambulatory Visit: Payer: Self-pay | Admitting: Family Medicine

## 2017-11-15 NOTE — Telephone Encounter (Signed)
She has hx of valve repair I sent it

## 2017-11-15 NOTE — Telephone Encounter (Signed)
Not on med list please advise 

## 2017-11-29 ENCOUNTER — Encounter: Payer: Self-pay | Admitting: Family Medicine

## 2017-11-29 DIAGNOSIS — M8589 Other specified disorders of bone density and structure, multiple sites: Secondary | ICD-10-CM | POA: Diagnosis not present

## 2017-12-01 ENCOUNTER — Encounter: Payer: Self-pay | Admitting: *Deleted

## 2018-01-18 ENCOUNTER — Encounter: Payer: Self-pay | Admitting: Family Medicine

## 2018-01-18 ENCOUNTER — Ambulatory Visit (INDEPENDENT_AMBULATORY_CARE_PROVIDER_SITE_OTHER): Payer: Medicare Other | Admitting: Family Medicine

## 2018-01-18 VITALS — BP 112/66 | HR 74 | Temp 97.9°F | Ht 61.5 in | Wt 126.5 lb

## 2018-01-18 DIAGNOSIS — S80861A Insect bite (nonvenomous), right lower leg, initial encounter: Secondary | ICD-10-CM

## 2018-01-18 DIAGNOSIS — W57XXXA Bitten or stung by nonvenomous insect and other nonvenomous arthropods, initial encounter: Secondary | ICD-10-CM | POA: Diagnosis not present

## 2018-01-18 MED ORDER — DOXYCYCLINE HYCLATE 100 MG PO TABS
100.0000 mg | ORAL_TABLET | Freq: Two times a day (BID) | ORAL | 0 refills | Status: DC
Start: 2018-01-18 — End: 2018-04-12

## 2018-01-18 NOTE — Patient Instructions (Signed)
I cannot rule out a tick bite  Keep the bite site clean and dry (soap and water- no peroxide)  Over the counter cortisone cream -is fine for itch   Alert me if increase in size or redness or if you get a rash or fever   Take as doxycycline as directed

## 2018-01-18 NOTE — Assessment & Plan Note (Signed)
Pt cannot r/o tick- unsure  Area does have some features of erythema migrans on R leg No fever/rash or other symptoms  Will tx with 10 d of doxycycline - to cover for skin infection and tick bourne illness  Watch for fever/rash/other symptoms  Also for change in wound Soap and water cleanse  Cortisone cream prn Do not scratch Update if not starting to improve in a week or if worsening

## 2018-01-18 NOTE — Progress Notes (Signed)
   Subjective:    Patient ID: Angelica Rivers, female    DOB: 1946-01-01, 72 y.o.   MRN: [id number]  HPI Here for an insect bite on her R leg   R inner thigh - little scab  Itchy  Scratched it  Used some peroxide on it   Then got red around it - then bigger and bigger  Still some itching -not bad   Did not see a tick but cannot r/o completely   No rash  No fever  Feels ok   Has not put anything on it     Review of Systems     Objective:   Physical Exam  Constitutional: She appears well-developed and well-nourished. No distress.  HENT:  Head: Normocephalic and atraumatic.  Eyes: Pupils are equal, round, and reactive to light. Conjunctivae and EOM are normal.  Neck: Normal range of motion. Neck supple.  Cardiovascular: Normal rate, regular rhythm and normal heart sounds.  Pulmonary/Chest: Effort normal and breath sounds normal. She has no wheezes. She has no rales.  Musculoskeletal: She exhibits no edema.  No acute joint changes   Lymphadenopathy:    She has no cervical adenopathy.  Neurological: She is alert. No cranial nerve deficit. Coordination normal.  Skin: Skin is warm and dry. No rash noted. She is not diaphoretic. No pallor.  5 by 6 cm oval area of erythema and induration- there is  Some clearing in a half circle laterally  Small 2 mm scab in middle No drainage  No insect parts seen   No rash elsewhere   Psychiatric: She has a normal mood and affect.  Pleasant           Assessment & Plan:   Problem List Items Addressed This Visit      Musculoskeletal and Integument   Insect bite of right leg - Primary    Pt cannot r/o tick- unsure  Area does have some features of erythema migrans on R leg No fever/rash or other symptoms  Will tx with 10 d of doxycycline - to cover for skin infection and tick bourne illness  Watch for fever/rash/other symptoms  Also for change in wound Soap and water cleanse  Cortisone cream prn Do not scratch Update if  not starting to improve in a week or if worsening

## 2018-04-03 DIAGNOSIS — A419 Sepsis, unspecified organism: Secondary | ICD-10-CM | POA: Diagnosis not present

## 2018-04-03 DIAGNOSIS — R197 Diarrhea, unspecified: Secondary | ICD-10-CM | POA: Diagnosis not present

## 2018-04-03 DIAGNOSIS — N390 Urinary tract infection, site not specified: Secondary | ICD-10-CM | POA: Diagnosis not present

## 2018-04-03 DIAGNOSIS — R1030 Lower abdominal pain, unspecified: Secondary | ICD-10-CM | POA: Diagnosis not present

## 2018-04-03 DIAGNOSIS — R6883 Chills (without fever): Secondary | ICD-10-CM | POA: Diagnosis not present

## 2018-04-03 DIAGNOSIS — N200 Calculus of kidney: Secondary | ICD-10-CM | POA: Diagnosis not present

## 2018-04-03 DIAGNOSIS — R509 Fever, unspecified: Secondary | ICD-10-CM | POA: Diagnosis not present

## 2018-04-03 DIAGNOSIS — K529 Noninfective gastroenteritis and colitis, unspecified: Secondary | ICD-10-CM | POA: Diagnosis not present

## 2018-04-03 DIAGNOSIS — K5730 Diverticulosis of large intestine without perforation or abscess without bleeding: Secondary | ICD-10-CM | POA: Diagnosis not present

## 2018-04-03 DIAGNOSIS — N281 Cyst of kidney, acquired: Secondary | ICD-10-CM | POA: Diagnosis not present

## 2018-04-12 ENCOUNTER — Encounter: Payer: Self-pay | Admitting: Family Medicine

## 2018-04-12 ENCOUNTER — Ambulatory Visit (INDEPENDENT_AMBULATORY_CARE_PROVIDER_SITE_OTHER): Payer: Medicare Other | Admitting: Family Medicine

## 2018-04-12 VITALS — BP 116/64 | HR 78 | Temp 97.9°F | Ht 61.5 in | Wt 124.0 lb

## 2018-04-12 DIAGNOSIS — K529 Noninfective gastroenteritis and colitis, unspecified: Secondary | ICD-10-CM | POA: Insufficient documentation

## 2018-04-12 DIAGNOSIS — R8290 Unspecified abnormal findings in urine: Secondary | ICD-10-CM

## 2018-04-12 DIAGNOSIS — N390 Urinary tract infection, site not specified: Secondary | ICD-10-CM | POA: Insufficient documentation

## 2018-04-12 DIAGNOSIS — N3000 Acute cystitis without hematuria: Secondary | ICD-10-CM

## 2018-04-12 LAB — POC URINALSYSI DIPSTICK (AUTOMATED)
Bilirubin, UA: NEGATIVE
Blood, UA: NEGATIVE
GLUCOSE UA: NEGATIVE
KETONES UA: NEGATIVE
NITRITE UA: NEGATIVE
PH UA: 6 (ref 5.0–8.0)
Protein, UA: NEGATIVE
Spec Grav, UA: >=1.03 — AB (ref 1.010–1.025)
Urobilinogen, UA: 0.2 E.U./dL

## 2018-04-12 NOTE — Progress Notes (Signed)
Subjective:    Patient ID: Angelica Rivers, female    DOB: 03-Jul-1946, 72 y.o.   MRN: [id number]  HPI Here for f[place] of ED visit  [place]  [place] 2 days after getting there she had cold chills  Felt worse and worse  [facility] - they told her to go to the hospital  Went to the ED   Had an episode of vomiting and diarrhea on the way there  Some dysuria also    Has uti -sent urine cx (they think) - did blood work  Also IVF  Put her on keflex 500 bid  Also flagyl 500 bid -- ? Colitis? -she really does not know   We do not have records    She has a cyst /vaginal- ? If this keeps her from emptying bladder   Took an xray with dye?    Wt Readings from Last 3 Encounters:  04/12/18 124 lb (56.2 kg)  01/18/18 126 lb 8 oz (57.4 kg)  10/04/17 126 lb (57.2 kg)   23.05 kg/m   She started to get better quickly  She was told to avoid dairy and greasy foods for 2 days   Now feels back to normal  Slowly getting back to regular activity  No urinary symptoms  Drinking lots of fluids- keeping urine dilute   UA today Results for orders placed or performed in visit on 04/12/18  POCT Urinalysis Dipstick (Automated)  Result Value Ref Range   Color, UA Yellow    Clarity, UA Clear    Glucose, UA Negative Negative   Bilirubin, UA Negative    Ketones, UA Negative    Spec Grav, UA >=1.030 (A) 1.010 - 1.025   Blood, UA Negative    pH, UA 6.0 5.0 - 8.0   Protein, UA Negative Negative   Urobilinogen, UA 0.2 0.2 or 1.0 E.U./dL   Nitrite, UA Negative    Leukocytes, UA Trace (A) Negative     Last colonoscopy was 3/18 -had polyp and also diverticuli   Patient Active Problem List   Diagnosis Date Noted  . UTI (urinary tract infection) 04/12/2018  . Insect bite of right leg 01/18/2018  . Estrogen deficiency 09/15/2015  . Colon cancer screening 12/17/2013  . Encounter for Medicare annual wellness exam 12/08/2013  . Routine general medical  examination at a health care facility 12/16/2012  . Gynecological examination 10/13/2011  . Vitamin D deficiency 09/23/2009  . Thrombocytopenia ([place]) 09/23/2009  . Hyperlipidemia 09/12/2008  . FIBROCYSTIC BREAST DISEASE 09/08/2007  . Osteopenia 09/08/2007  . NEPHROLITHIASIS, HX OF 09/08/2007   Past Medical History:  Diagnosis Date  . Anemia   . History of repair of mitral valve    proph for dentist  . Nephrolithiasis    Hx of  . Osteopenia   . Osteoporosis    fosamax for over 5 yrs.  . Vaginal cyst    stable   Past Surgical History:  Procedure Laterality Date  . CARDIAC SURGERY  1995   mitral valve repair carpenters ring  . COLONOSCOPY    . HEMORRHOID SURGERY    . lithotripsy for kidney stones    . MITRAL VALVE REPAIR  2004  . ruptured ovarian cyst surgery    . skin graft dog bite  2010  . TONSILLECTOMY     Social History   Tobacco Use  . Smoking status: Never Smoker  . Smokeless tobacco: Never Used  Substance Use Topics  . Alcohol use: No    Alcohol/week: 0.0 standard drinks  . Drug use: No   Family History  Problem Relation Age of Onset  . Colon cancer Mother   . Diabetes Brother   . Esophageal cancer Neg Hx   . Rectal cancer Neg Hx   . Stomach cancer Neg Hx    Allergies  Allergen Reactions  . Neosporin [Neomycin-Bacitracin Zn-Polymyx] Other (See Comments)    Festers up  . Shingrix [Zoster Vac Recomb Adjuvanted]     Rash    Current Outpatient Medications on File Prior to Visit  Medication Sig Dispense Refill  . amoxicillin (AMOXIL) 500 MG capsule TAKE 4 CAPSULES BY MOUTH BEFORE DENTAL APPOINTMENT 4 capsule 3  . aspirin 81 MG chewable tablet Chew 81 mg by mouth every other day.     . cephALEXin (KEFLEX) 500 MG capsule Take 1 capsule by mouth 2 (two) times daily.  0  . Cholecalciferol (VITAMIN D) 2000 UNITS CAPS Take 2 capsules by mouth daily.    . Flaxseed, Linseed, (FLAX SEED OIL) 1000 MG CAPS Take 1 capsule by mouth daily.     . metroNIDAZOLE  (FLAGYL) 500 MG tablet Take 1 tablet by mouth 3 (three) times daily.  0   No current facility-administered medications on file prior to visit.     Review of Systems  Constitutional: Negative for activity change, appetite change, fatigue, fever and unexpected weight change.  HENT: Negative for congestion, ear pain, rhinorrhea, sinus pressure and sore throat.   Eyes: Negative for pain, redness and visual disturbance.  Respiratory: Negative for cough, shortness of breath and wheezing.   Cardiovascular: Negative for chest pain and palpitations.  Gastrointestinal: Negative for abdominal distention, abdominal pain, anal bleeding, blood in stool, constipation, diarrhea, nausea and vomiting.       Symptoms are all resolved   Endocrine: Negative for polydipsia and polyuria.  Genitourinary: Negative for dysuria, flank pain, frequency, hematuria, pelvic pain, urgency and vaginal discharge.  Musculoskeletal: Negative for arthralgias, back pain and myalgias.  Skin: Negative for pallor and rash.  Allergic/Immunologic: Negative for environmental allergies.  Neurological: Negative for dizziness, syncope and headaches.  Hematological: Negative for adenopathy. Does not bruise/bleed easily.  Psychiatric/Behavioral: Negative for decreased concentration and dysphoric mood. The patient is not nervous/anxious.        Objective:   Physical Exam  Constitutional: She appears well-developed and well-nourished. No distress.  Well appearing   HENT:  Head: Normocephalic and atraumatic.  Mouth/Throat: Oropharynx is clear and moist.  Eyes: Pupils are equal, round, and reactive to light. Conjunctivae and EOM are normal.  Neck: Normal range of motion. Neck supple. No JVD present. Carotid bruit is not present. No thyromegaly present.  Cardiovascular: Normal rate, regular rhythm, normal heart sounds and intact distal pulses. Exam reveals no gallop.  Pulmonary/Chest: Effort normal and breath sounds normal. No  respiratory distress. She has no wheezes. She has no rales.  No crackles  Abdominal: Soft. Bowel sounds are normal. She exhibits no distension, no abdominal bruit, no pulsatile midline mass and no mass. There is no tenderness. There is no rigidity, no rebound, no guarding, no CVA tenderness, no tenderness at McBurney's point and negative Murphy's sign.  No suprapubic tenderness or fullness   No cva tenderness   Musculoskeletal: She exhibits no edema.  Lymphadenopathy:    She has no cervical adenopathy.  Neurological: She is alert. She has normal reflexes. She displays normal reflexes. No cranial nerve deficit. Coordination normal.  Skin: Skin is warm and dry. Capillary refill takes less than 2 seconds. No rash noted.  Psychiatric: She has a normal mood and affect.          Assessment & Plan:   Problem List Items Addressed This Visit      Digestive   Colitis    Was able to review pt's d/c hosp summary several hours after she left  Was diag with mild possible colitis per CT scan of abd/pelvis Finishing flagyl  Symptoms of n/v/d and fever are completely resolved  Urged to alert [place] if symptoms return  Unsure of etioligy        Genitourinary   UTI (urinary tract infection) - Primary    Pt was recently seen at outside ED for this -she thinks Taking keflex 500 mg bid - close to finishing it  Symptoms resolved Enc to keep drinking water  UA today  Sent for records-will review when they arrive       Relevant Medications   cephALEXin (KEFLEX) 500 MG capsule   metroNIDAZOLE (FLAGYL) 500 MG tablet   Other Relevant Orders   POCT Urinalysis Dipstick (Automated) (Completed)   Urine Culture    Other Visit Diagnoses    Abnormal urinalysis       Relevant Orders   Urine Culture

## 2018-04-12 NOTE — Patient Instructions (Addendum)
Keep drinking fluids Finish your medicine   If symptoms - let us know right away Let's check a urinalysis (culture if needed) today   Once I get records I will know if we have to do anything else

## 2018-04-12 NOTE — Assessment & Plan Note (Signed)
Was able to review pt's d/c hosp summary several hours after she left  Was diag with mild possible colitis per CT scan of abd/pelvis Finishing flagyl  Symptoms of n/v/d and fever are completely resolved  Urged to alert [place] if symptoms return  Unsure of etioligy

## 2018-04-12 NOTE — Assessment & Plan Note (Signed)
Pt was recently seen at outside ED for this -she thinks Taking keflex 500 mg bid - close to finishing it  Symptoms resolved Enc to keep drinking water  UA today  Sent for records-will review when they arrive

## 2018-04-13 LAB — URINE CULTURE
MICRO NUMBER:: 90965755
SPECIMEN QUALITY:: ADEQUATE

## 2018-08-28 DIAGNOSIS — R300 Dysuria: Secondary | ICD-10-CM | POA: Diagnosis not present

## 2018-08-28 DIAGNOSIS — A499 Bacterial infection, unspecified: Secondary | ICD-10-CM | POA: Diagnosis not present

## 2018-08-28 DIAGNOSIS — N390 Urinary tract infection, site not specified: Secondary | ICD-10-CM | POA: Diagnosis not present

## 2018-10-01 ENCOUNTER — Telehealth: Payer: Self-pay | Admitting: Family Medicine

## 2018-10-01 DIAGNOSIS — E559 Vitamin D deficiency, unspecified: Secondary | ICD-10-CM

## 2018-10-01 DIAGNOSIS — Z0000 Encounter for general adult medical examination without abnormal findings: Secondary | ICD-10-CM

## 2018-10-01 DIAGNOSIS — D696 Thrombocytopenia, unspecified: Secondary | ICD-10-CM

## 2018-10-01 DIAGNOSIS — E7800 Pure hypercholesterolemia, unspecified: Secondary | ICD-10-CM

## 2018-10-01 NOTE — Telephone Encounter (Signed)
-----   Message from Eustace Pen, LPN sent at [id number]/78/6767  3:21 PM EST ----- Regarding: Labs 2/3 Lab orders needed. Thank you.

## 2018-10-02 ENCOUNTER — Ambulatory Visit (INDEPENDENT_AMBULATORY_CARE_PROVIDER_SITE_OTHER): Payer: Medicare Other

## 2018-10-02 VITALS — BP 110/84 | HR 80 | Temp 97.9°F | Ht 62.5 in | Wt 123.8 lb

## 2018-10-02 DIAGNOSIS — E559 Vitamin D deficiency, unspecified: Secondary | ICD-10-CM | POA: Diagnosis not present

## 2018-10-02 DIAGNOSIS — Z0000 Encounter for general adult medical examination without abnormal findings: Secondary | ICD-10-CM

## 2018-10-02 DIAGNOSIS — E7800 Pure hypercholesterolemia, unspecified: Secondary | ICD-10-CM

## 2018-10-02 DIAGNOSIS — D696 Thrombocytopenia, unspecified: Secondary | ICD-10-CM | POA: Diagnosis not present

## 2018-10-02 LAB — LIPID PANEL
CHOL/HDL RATIO: 3
Cholesterol: 196 mg/dL (ref 0–200)
HDL: 66.1 mg/dL (ref >39.00)
LDL Cholesterol: 117 mg/dL — ABNORMAL HIGH (ref 0–99)
NonHDL: 129.84
Triglycerides: 64 mg/dL (ref 0.0–149.0)
VLDL: 12.8 mg/dL (ref 0.0–40.0)

## 2018-10-02 LAB — COMPREHENSIVE METABOLIC PANEL
ALT: 30 U/L (ref 0–35)
AST: 26 U/L (ref 0–37)
Albumin: 4.3 g/dL (ref 3.5–5.2)
Alkaline Phosphatase: 45 U/L (ref 39–117)
BUN: 17 mg/dL (ref 6–23)
CO2: 28 meq/L (ref 19–32)
Calcium: 9.5 mg/dL (ref 8.4–10.5)
Chloride: 106 mEq/L (ref 96–112)
Creatinine, Ser: 0.89 mg/dL (ref 0.40–1.20)
GFR: 62.27 mL/min (ref >60.00)
GLUCOSE: 86 mg/dL (ref 70–99)
Potassium: 4.5 mEq/L (ref 3.5–5.1)
Sodium: 140 mEq/L (ref 135–145)
Total Bilirubin: 0.4 mg/dL (ref 0.2–1.2)
Total Protein: 7.3 g/dL (ref 6.0–8.3)

## 2018-10-02 LAB — CBC WITH DIFFERENTIAL/PLATELET
Basophils Absolute: 0 10*3/uL (ref 0.0–0.1)
Basophils Relative: 0.3 % (ref 0.0–3.0)
Eosinophils Absolute: 0.1 10*3/uL (ref 0.0–0.7)
Eosinophils Relative: 1.6 % (ref 0.0–5.0)
HCT: 40.5 % (ref 36.0–46.0)
Hemoglobin: 13.1 g/dL (ref 12.0–15.0)
Lymphocytes Relative: 32.3 % (ref 12.0–46.0)
Lymphs Abs: 1.3 10*3/uL (ref 0.7–4.0)
MCHC: 32.4 g/dL (ref 30.0–36.0)
MCV: 89.6 fl (ref 78.0–100.0)
Monocytes Absolute: 0.3 10*3/uL (ref 0.1–1.0)
Monocytes Relative: 8.7 % (ref 3.0–12.0)
Neutro Abs: 2.3 10*3/uL (ref 1.4–7.7)
Neutrophils Relative %: 57.1 % (ref 43.0–77.0)
Platelets: 131 10*3/uL — ABNORMAL LOW (ref 150.0–400.0)
RBC: 4.52 Mil/uL (ref 3.87–5.11)
RDW: 14.1 % (ref 11.5–15.5)
WBC: 4 10*3/uL (ref 4.0–10.5)

## 2018-10-02 LAB — TSH: TSH: 1.45 u[IU]/mL (ref 0.35–4.50)

## 2018-10-02 LAB — VITAMIN D 25 HYDROXY (VIT D DEFICIENCY, FRACTURES): VITD: 83.85 ng/mL (ref 30.00–100.00)

## 2018-10-02 NOTE — Progress Notes (Signed)
Subjective:   Angelica Rivers is a 73 y.o. female who presents for Medicare Annual (Subsequent) preventive examination.  Review of Systems:  N/A Cardiac Risk Factors include: advanced age (>39men, >74 women);dyslipidemia     Objective:     Vitals: BP 110/84 (BP Location: Left Arm, Patient Position: Sitting, Cuff Size: Normal)   Pulse 80   Temp 97.9 F (36.6 C) (Oral)   Ht 5' 2.5" (1.588 m) Comment: shoes  Wt 123 lb 12 oz (56.1 kg)   SpO2 98%   BMI 22.27 kg/m   Body mass index is 22.27 kg/m.  Advanced Directives 09/27/2017 11/10/2016 09/23/2016 01/24/2016  Does Patient Have a Medical Advance Directive? No No No No  Would patient like information on creating a medical advance directive? No - Patient declined - - No - patient declined information    Tobacco Social History   Tobacco Use  Smoking Status Never Smoker  Smokeless Tobacco Never Used     Counseling given: No   Clinical Intake:  Pre-visit preparation completed: Yes  Pain : No/denies pain Pain Score: 0-No pain     Nutritional Status: BMI of 19-24  Normal Nutritional Risks: None Diabetes: No  How often do you need to have someone help you when you read instructions, pamphlets, or other written materials from your doctor or pharmacy?: 1 - Never What is the last grade level you completed in school?: 12th grade  Interpreter Needed?: No  Comments: pt lives with spouse Information entered by :: LPinson, LPN  Past Medical History:  Diagnosis Date  . Anemia   . History of repair of mitral valve    proph for dentist  . Nephrolithiasis    Hx of  . Osteopenia   . Osteoporosis    fosamax for over 5 yrs.  . Vaginal cyst    stable   Past Surgical History:  Procedure Laterality Date  . CARDIAC SURGERY  1995   mitral valve repair carpenters ring  . COLONOSCOPY    . HEMORRHOID SURGERY    . lithotripsy for kidney stones    . MITRAL VALVE REPAIR  2004  . ruptured ovarian cyst surgery    . skin graft  dog bite  2010  . TONSILLECTOMY     Family History  Problem Relation Age of Onset  . Colon cancer Mother   . Diabetes Brother   . Esophageal cancer Neg Hx   . Rectal cancer Neg Hx   . Stomach cancer Neg Hx    Social History   Socioeconomic History  . Marital status: Married    Spouse name: Not on file  . Number of children: Not on file  . Years of education: Not on file  . Highest education level: Not on file  Occupational History  . Not on file  Social Needs  . Financial resource strain: Not on file  . Food insecurity:    Worry: Not on file    Inability: Not on file  . Transportation needs:    Medical: Not on file    Non-medical: Not on file  Tobacco Use  . Smoking status: Never Smoker  . Smokeless tobacco: Never Used  Substance and Sexual Activity  . Alcohol use: No    Alcohol/week: 0.0 standard drinks  . Drug use: No  . Sexual activity: Never  Lifestyle  . Physical activity:    Days per week: Not on file    Minutes per session: Not on file  . Stress: Not on  file  Relationships  . Social connections:    Talks on phone: Not on file    Gets together: Not on file    Attends religious service: Not on file    Active member of club or organization: Not on file    Attends meetings of clubs or organizations: Not on file    Relationship status: Not on file  Other Topics Concern  . Not on file  Social History Narrative  . Not on file    Outpatient Encounter Medications as of 10/02/2018  Medication Sig  . amoxicillin (AMOXIL) 500 MG capsule TAKE 4 CAPSULES BY MOUTH BEFORE DENTAL APPOINTMENT  . aspirin 81 MG chewable tablet Chew 81 mg by mouth every other day.   . Cholecalciferol (VITAMIN D) 2000 UNITS CAPS Take 2 capsules by mouth daily.  . Flaxseed, Linseed, (FLAX SEED OIL) 1000 MG CAPS Take 1 capsule by mouth daily.   . [DISCONTINUED] cephALEXin (KEFLEX) 500 MG capsule Take 1 capsule by mouth 2 (two) times daily.  . [DISCONTINUED] metroNIDAZOLE (FLAGYL) 500 MG  tablet Take 1 tablet by mouth 3 (three) times daily.   No facility-administered encounter medications on file as of 10/02/2018.     Activities of Daily Living In your present state of health, do you have any difficulty performing the following activities: 10/02/2018  Hearing? N  Vision? N  Difficulty concentrating or making decisions? N  Walking or climbing stairs? N  Dressing or bathing? N  Doing errands, shopping? N  Preparing Food and eating ? N  Using the Toilet? N  In the past six months, have you accidently leaked urine? N  Do you have problems with loss of bowel control? N  Managing your Medications? N  Managing your Finances? N  Housekeeping or managing your Housekeeping? N  Some recent data might be hidden    Patient Care Team: Tower, Wynelle Fanny, MD as PCP - General    Assessment:   This is a routine wellness examination for Angelica Rivers.   Hearing Screening   125Hz  250Hz  500Hz  1000Hz  2000Hz  3000Hz  4000Hz  6000Hz  8000Hz   Right ear:   40 40 40  0    Left ear:   40 40 40  40    Vision Screening Comments: Vision exam in 2019 with Dr. Hassell Rivers   Exercise Activities and Dietary recommendations Current Exercise Habits: The patient does not participate in regular exercise at present, Exercise limited by: None identified  Goals    . DIET - INCREASE WATER INTAKE     Starting 10/02/2018, I will continue to drink at least 6-8 glasses of water daily.        Fall Risk Fall Risk  10/02/2018 09/27/2017 09/23/2016 09/20/2016 09/15/2015  Falls in the past year? 0 Yes No Yes No  Comment - pt fell over cord in yard; caused sprain to left thumb - - -  Number falls in past yr: - 1 - - -  Injury with Fall? - Yes - - -   Depression Screen PHQ 2/9 Scores 10/02/2018 09/27/2017 09/23/2016 09/20/2016  PHQ - 2 Score 0 0 0 0  PHQ- 9 Score 0 0 - -     Cognitive Function MMSE - Mini Mental State Exam 10/02/2018 09/27/2017 09/23/2016  Orientation to time 5 5 5   Orientation to Place 5 5 5   Registration 3 3 3     Attention/ Calculation 0 0 0  Recall 3 3 3   Language- name 2 objects 0 0 0  Language- repeat 1 1  1  Language- follow 3 step command 3 3 3   Language- read & follow direction 0 0 0  Write a sentence 0 0 0  Copy design 0 0 0  Total score 20 20 20      PLEASE NOTE: A Mini-Cog screen was completed. Maximum score is 20. A value of 0 denotes this part of Folstein MMSE was not completed or the patient failed this part of the Mini-Cog screening.   Mini-Cog Screening Orientation to Time - Max 5 pts Orientation to Place - Max 5 pts Registration - Max 3 pts Recall - Max 3 pts Language Repeat - Max 1 pts Language Follow 3 Step Command - Max 3 pts     Immunization History  Administered Date(s) Administered  . 19-influenza Whole 06/14/2015  . Influenza Whole 09/08/2006  . Influenza, High Dose Seasonal PF 06/21/2014, 06/23/2016, 06/14/2017, 06/11/2018  . Influenza-Unspecified 05/30/2013  . Pneumococcal Conjugate-13 12/17/2013  . Pneumococcal Polysaccharide-23 10/13/2011  . Td 04/20/2002, 02/27/2009  . Tdap 12/25/2012, 03/06/2014  . Zoster 01/24/2013  . Zoster Recombinat (Shingrix) 01/05/2017    Screening Tests Health Maintenance  Topic Date Due  . MAMMOGRAM  08/30/2019 (Originally 05/23/2018)  . PAP SMEAR-Modifier  09/27/2048 (Originally 10/12/2012)  . DEXA SCAN  11/29/2020  . COLONOSCOPY  11/10/2021  . TETANUS/TDAP  03/06/2024  . INFLUENZA VACCINE  Completed  . Hepatitis C Screening  Completed  . PNA vac Low Risk Adult  Completed      Plan:     I have personally reviewed, addressed, and noted the following in the patient's chart:  A. Medical and social history B. Use of alcohol, tobacco or illicit drugs  C. Current medications and supplements D. Functional ability and status E.  Nutritional status F.  Physical activity G. Advance directives H. List of other physicians I.  Hospitalizations, surgeries, and ER visits in previous 12 months J.  [place] to include  hearing, vision, cognitive, depression L. Referrals and appointments - none  In addition, I have reviewed and discussed with patient certain preventive protocols, quality metrics, and best practice recommendations. A written personalized care plan for preventive services as well as general preventive health recommendations were provided to patient.  See attached scanned questionnaire for additional information.   Signed,   Lindell Noe, MHA, BS, LPN Health Coach

## 2018-10-02 NOTE — Progress Notes (Signed)
PCP notes:   Health maintenance:  Mammogram - addressed  Abnormal screenings:   Hearing - failed  Hearing Screening   125Hz  250Hz  500Hz  1000Hz  2000Hz  3000Hz  4000Hz  6000Hz  8000Hz   Right ear:   40 40 40  0    Left ear:   40 40 40  40     Patient concerns:   None  Nurse concerns:  None  Next PCP appt:   10/11/18 @ 0930  I reviewed health advisor's note, was available for consultation, and agree with documentation and plan. Loura Pardon MD

## 2018-10-02 NOTE — Patient Instructions (Addendum)
Angelica Rivers , Thank you for taking time to come for your Medicare Wellness Visit. I appreciate your ongoing commitment to your health goals. Please review the following plan we discussed and let me know if I can assist you in the future.   These are the goals we discussed: Goals    . DIET - INCREASE WATER INTAKE     Starting 10/02/2018, I will continue to drink at least 6-8 glasses of water daily.        This is a list of the screening recommended for you and due dates:  Health Maintenance  Topic Date Due  . Mammogram  08/30/2019*  . Pap Smear  09/27/2048*  . DEXA scan (bone density measurement)  11/29/2020  . Colon Cancer Screening  11/10/2021  . Tetanus Vaccine  03/06/2024  . Flu Shot  Completed  .  Hepatitis C: One time screening is recommended by Center for Disease Control  (CDC) for  adults born from [birth year] through 1965.   Completed  . Pneumonia vaccines  Completed  *Topic was postponed. The date shown is not the original due date.   Preventive Care for Adults  A healthy lifestyle and preventive care can promote health and wellness. Preventive health guidelines for adults include the following key practices.  . A routine yearly physical is a good way to check with your health care provider about your health and preventive screening. It is a chance to share any concerns and updates on your health and to receive a thorough exam.  . Visit your dentist for a routine exam and preventive care every 6 months. Brush your teeth twice a day and floss once a day. Good oral hygiene prevents tooth decay and gum disease.  . The frequency of eye exams is based on your age, health, family medical history, use  of contact lenses, and other factors. Follow your health care provider's recommendations for frequency of eye exams.  . Eat a healthy diet. Foods like vegetables, fruits, whole grains, low-fat dairy products, and lean protein foods contain the nutrients you need without too many calories.  Decrease your intake of foods high in solid fats, added sugars, and salt. Eat the right amount of calories for you. Get information about a proper diet from your health care provider, if necessary.  . Regular physical exercise is one of the most important things you can do for your health. Most adults should get at least 150 minutes of moderate-intensity exercise (any activity that increases your heart rate and causes you to sweat) each week. In addition, most adults need muscle-strengthening exercises on 2 or more days a week.  Silver Sneakers may be a benefit available to you. To determine eligibility, you may visit the website: www.silversneakers.com or contact program at [phone]) [phone] Mon-Fri between 8AM-8PM.   . Maintain a healthy weight. The body mass index (BMI) is a screening tool to identify possible weight problems. It provides an estimate of body fat based on height and weight. Your health care provider can find your BMI and can help you achieve or maintain a healthy weight.   For adults 20 years and older: ? A BMI below 18.5 is considered underweight. ? A BMI of 18.5 to 24.9 is normal. ? A BMI of 25 to 29.9 is considered overweight. ? A BMI of 30 and above is considered obese.   . Maintain normal blood lipids and cholesterol levels by exercising and minimizing your intake of saturated fat. Eat a balanced diet with plenty  of fruit and vegetables. Blood tests for lipids and cholesterol should begin at age 43 and be repeated every 5 years. If your lipid or cholesterol levels are high, you are over 50, or you are at high risk for heart disease, you may need your cholesterol levels checked more frequently. Ongoing high lipid and cholesterol levels should be treated with medicines if diet and exercise are not working.  . If you smoke, find out from your health care provider how to quit. If you do not use tobacco, please do not start.  . If you choose to drink alcohol, please do not consume  more than 2 drinks per day. One drink is considered to be 12 ounces (355 mL) of beer, 5 ounces (148 mL) of wine, or 1.5 ounces (44 mL) of liquor.  . If you are 32-47 years old, ask your health care provider if you should take aspirin to prevent strokes.  . Use sunscreen. Apply sunscreen liberally and repeatedly throughout the day. You should seek shade when your shadow is shorter than you. Protect yourself by wearing long sleeves, pants, a wide-brimmed hat, and sunglasses year round, whenever you are outdoors.  . Once a month, do a whole body skin exam, using a mirror to look at the skin on your back. Tell your health care provider of new moles, moles that have irregular borders, moles that are larger than a pencil eraser, or moles that have changed in shape or color.

## 2018-10-06 ENCOUNTER — Encounter: Payer: Medicare Other | Admitting: Family Medicine

## 2018-10-11 ENCOUNTER — Ambulatory Visit (INDEPENDENT_AMBULATORY_CARE_PROVIDER_SITE_OTHER): Payer: Medicare Other | Admitting: Family Medicine

## 2018-10-11 ENCOUNTER — Encounter: Payer: Self-pay | Admitting: Family Medicine

## 2018-10-11 VITALS — BP 138/86 | HR 84 | Temp 97.9°F | Ht 62.5 in | Wt 123.8 lb

## 2018-10-11 DIAGNOSIS — Z0000 Encounter for general adult medical examination without abnormal findings: Secondary | ICD-10-CM

## 2018-10-11 DIAGNOSIS — M8589 Other specified disorders of bone density and structure, multiple sites: Secondary | ICD-10-CM | POA: Diagnosis not present

## 2018-10-11 DIAGNOSIS — E559 Vitamin D deficiency, unspecified: Secondary | ICD-10-CM | POA: Diagnosis not present

## 2018-10-11 DIAGNOSIS — D696 Thrombocytopenia, unspecified: Secondary | ICD-10-CM | POA: Diagnosis not present

## 2018-10-11 DIAGNOSIS — E7800 Pure hypercholesterolemia, unspecified: Secondary | ICD-10-CM | POA: Diagnosis not present

## 2018-10-11 NOTE — Assessment & Plan Note (Signed)
Vitamin D level is therapeutic with current supplementation Disc importance of this to bone and overall health Level in 80s

## 2018-10-11 NOTE — Assessment & Plan Note (Signed)
dexa with mixed trend 4/19 No falls or fx  D level is tx  Enc exercise  Re check at 2 y

## 2018-10-11 NOTE — Patient Instructions (Addendum)
Please schedule your mammogram   For cholesterol Avoid red meat/ fried foods/ egg yolks/ fatty breakfast meats/ butter, cheese and high fat dairy/ and shellfish    Take care of yourself!  Stay active and eat healthy  Wear your sunscreen

## 2018-10-11 NOTE — Assessment & Plan Note (Signed)
Reviewed health habits including diet and exercise and skin cancer prevention Reviewed appropriate screening tests for age  Also reviewed health mt list, fam hx and immunization status , as well as social and family history   See HPI Labs reviewed  Pt will schedule her own mammogram (overdue)  Disc diet for cholesterol

## 2018-10-11 NOTE — Assessment & Plan Note (Signed)
Disc goals for lipids and reasons to control them Rev last labs with pt Rev low sat fat diet in detail   LDL is up a bit  Will cut back red meat and fried foods

## 2018-10-11 NOTE — Assessment & Plan Note (Signed)
Platelet count in 130s No symptoms  Continue to watch

## 2018-10-11 NOTE — Progress Notes (Signed)
Subjective:    Patient ID: Angelica Rivers, female    DOB: Mar 24, 1946, 72 y.o.   MRN: [id number]  HPI Here for health maintenance exam and to review chronic medical problems   Feeling good overall   Keeping her grandchild 31 yo   Wt Readings from Last 3 Encounters:  10/11/18 123 lb 12 oz (56.1 kg)  10/02/18 123 lb 12 oz (56.1 kg)  04/12/18 124 lb (56.2 kg)  wt is stable  22.27 kg/m   Had amw on 2/13 Missed highest tone R ear on hearing screen She has not noticed problems herself   Breast cancer screening  Mammogram 9/18 = [facility]  She plans to make it herself  No lumps on self exam   Pap 3/13 neg with neg HPV screen No gyn symptoms   dexa 4/19  Osteopenia - worse in radius and better in FN D level of 83 No falls  No fractures  Had thumb fx years ago  Takes ca and D Running after 73 yo for exercise and also walking and stairs   Colonoscopy 3/18  Mother had colon cancer   shingrix vaccine 5/18- could not get 2nd dose due to rash from the first imm  Hyperlipidemia Lab Results  Component Value Date   CHOL 196 10/02/2018   CHOL 189 09/27/2017   CHOL 183 09/13/2016   Lab Results  Component Value Date   HDL 66.10 10/02/2018   HDL 68.20 09/27/2017   HDL 69.00 09/13/2016   Lab Results  Component Value Date   LDLCALC 117 (H) 10/02/2018   LDLCALC 106 (H) 09/27/2017   LDLCALC 102 (H) 09/13/2016   Lab Results  Component Value Date   TRIG 64.0 10/02/2018   TRIG 74.0 09/27/2017   TRIG 63.0 09/13/2016   Lab Results  Component Value Date   CHOLHDL 3 10/02/2018   CHOLHDL 3 09/27/2017   CHOLHDL 3 09/13/2016   Lab Results  Component Value Date   LDLDIRECT 117.7 10/06/2011   LDLDIRECT 127.8 09/22/2010   LDLDIRECT 120.8 09/12/2008  LDL is up a little  Not eating differently  Does eat red meat and fried foods   Hx of low platelet  Lab Results  Component Value Date   WBC 4.0 10/02/2018   HGB 13.1 10/02/2018   HCT 40.5 10/02/2018   MCV 89.6 10/02/2018     PLT 131.0 (L) 10/02/2018    Other labs ok  Lab Results  Component Value Date   CREATININE 0.89 10/02/2018   BUN 17 10/02/2018   NA 140 10/02/2018   K 4.5 10/02/2018   CL 106 10/02/2018   CO2 28 10/02/2018   Lab Results  Component Value Date   TSH 1.45 10/02/2018    Lab Results  Component Value Date   ALT 30 10/02/2018   AST 26 10/02/2018   ALKPHOS 45 10/02/2018   BILITOT 0.4 10/02/2018    Patient Active Problem List   Diagnosis Date Noted  . Colitis 04/12/2018  . Estrogen deficiency 09/15/2015  . Colon cancer screening 12/17/2013  . Encounter for Medicare annual wellness exam 12/08/2013  . Routine general medical examination at a health care facility 12/16/2012  . Gynecological examination 10/13/2011  . Vitamin D deficiency 09/23/2009  . Thrombocytopenia ([place]) 09/23/2009  . Hyperlipidemia 09/12/2008  . FIBROCYSTIC BREAST DISEASE 09/08/2007  . Osteopenia 09/08/2007  . NEPHROLITHIASIS, HX OF 09/08/2007   Past Medical History:  Diagnosis Date  . Anemia   . History of repair of mitral valve  proph for dentist  . Nephrolithiasis    Hx of  . Osteopenia   . Osteoporosis    fosamax for over 5 yrs.  . Vaginal cyst    stable   Past Surgical History:  Procedure Laterality Date  . CARDIAC SURGERY  1995   mitral valve repair carpenters ring  . COLONOSCOPY    . HEMORRHOID SURGERY    . lithotripsy for kidney stones    . MITRAL VALVE REPAIR  2004  . ruptured ovarian cyst surgery    . skin graft dog bite  2010  . TONSILLECTOMY     Social History   Tobacco Use  . Smoking status: Never Smoker  . Smokeless tobacco: Never Used  Substance Use Topics  . Alcohol use: No    Alcohol/week: 0.0 standard drinks  . Drug use: No   Family History  Problem Relation Age of Onset  . Colon cancer Mother   . Diabetes Brother   . Esophageal cancer Neg Hx   . Rectal cancer Neg Hx   . Stomach cancer Neg Hx    Allergies  Allergen Reactions  . Neosporin  [Neomycin-Bacitracin Zn-Polymyx] Other (See Comments)    Festers up  . Shingrix [Zoster Vac Recomb Adjuvanted]     Rash    Current Outpatient Medications on File Prior to Visit  Medication Sig Dispense Refill  . amoxicillin (AMOXIL) 500 MG capsule TAKE 4 CAPSULES BY MOUTH BEFORE DENTAL APPOINTMENT 4 capsule 3  . aspirin 81 MG chewable tablet Chew 81 mg by mouth every other day.     . Cholecalciferol (VITAMIN D) 2000 UNITS CAPS Take 2 capsules by mouth daily.    . Flaxseed, Linseed, (FLAX SEED OIL) 1000 MG CAPS Take 1 capsule by mouth daily.      No current facility-administered medications on file prior to visit.      Review of Systems  Constitutional: Negative for activity change, appetite change, fatigue, fever and unexpected weight change.  HENT: Negative for congestion, ear pain, rhinorrhea, sinus pressure and sore throat.   Eyes: Negative for pain, redness and visual disturbance.  Respiratory: Negative for cough, shortness of breath and wheezing.   Cardiovascular: Negative for chest pain and palpitations.  Gastrointestinal: Negative for abdominal pain, blood in stool, constipation and diarrhea.  Endocrine: Negative for polydipsia and polyuria.  Genitourinary: Negative for dysuria, frequency and urgency.  Musculoskeletal: Negative for arthralgias, back pain and myalgias.  Skin: Negative for pallor and rash.  Allergic/Immunologic: Negative for environmental allergies.  Neurological: Negative for dizziness, syncope and headaches.  Hematological: Negative for adenopathy. Does not bruise/bleed easily.  Psychiatric/Behavioral: Negative for decreased concentration and dysphoric mood. The patient is not nervous/anxious.        Objective:   Physical Exam Constitutional:      General: She is not in acute distress.    Appearance: Normal appearance. She is well-developed and normal weight.  HENT:     Head: Normocephalic and atraumatic.     Right Ear: Tympanic membrane, ear canal and  external ear normal.     Left Ear: Tympanic membrane, ear canal and external ear normal.     Ears:     Comments: Scant cerumen     Mouth/Throat:     Mouth: Mucous membranes are moist.     Pharynx: Oropharynx is clear.  Eyes:     General: No scleral icterus.    Conjunctiva/sclera: Conjunctivae normal.     Pupils: Pupils are equal, round, and reactive to light.  Neck:     Musculoskeletal: Normal range of motion and neck supple.     Thyroid: No thyromegaly.     Vascular: No carotid bruit or JVD.  Cardiovascular:     Rate and Rhythm: Normal rate and regular rhythm.     Pulses: Normal pulses.     Heart sounds: Normal heart sounds. No gallop.   Pulmonary:     Effort: Pulmonary effort is normal. No respiratory distress.     Breath sounds: Normal breath sounds. No wheezing.  Chest:     Chest wall: No tenderness.  Abdominal:     General: Bowel sounds are normal. There is no distension or abdominal bruit.     Palpations: Abdomen is soft. There is no mass.     Tenderness: There is no abdominal tenderness.  Genitourinary:    Comments: Breast exam: No mass, nodules, thickening, tenderness, bulging, retraction, inflamation, nipple discharge or skin changes noted.  No axillary or clavicular LA.     Musculoskeletal: Normal range of motion.        General: No tenderness or deformity.     Right lower leg: No edema.     Left lower leg: No edema.  Lymphadenopathy:     Cervical: No cervical adenopathy.  Skin:    General: Skin is warm and dry.     Capillary Refill: Capillary refill takes less than 2 seconds.     Coloration: Skin is not pale.     Findings: No erythema, lesion or rash.     Comments: Solar lentigines diffusely   Neurological:     General: No focal deficit present.     Mental Status: She is alert.     Cranial Nerves: No cranial nerve deficit.     Motor: No abnormal muscle tone.     Coordination: Coordination normal.     Deep Tendon Reflexes: Reflexes are normal and symmetric.    Psychiatric:        Mood and Affect: Mood normal.           Assessment & Plan:   Problem List Items Addressed This Visit      Musculoskeletal and Integument   Osteopenia    dexa with mixed trend 4/19 No falls or fx  D level is tx  Enc exercise  Re check at 2 y        Other   Vitamin D deficiency    Vitamin D level is therapeutic with current supplementation Disc importance of this to bone and overall health Level in 80s       Hyperlipidemia    Disc goals for lipids and reasons to control them Rev last labs with pt Rev low sat fat diet in detail   LDL is up a bit  Will cut back red meat and fried foods       Thrombocytopenia ([facility])    Platelet count in 130s No symptoms  Continue to watch      Routine general medical examination at a health care facility - Primary    Reviewed health habits including diet and exercise and skin cancer prevention Reviewed appropriate screening tests for age  Also reviewed health mt list, fam hx and immunization status , as well as social and family history   See HPI Labs reviewed  Pt will schedule her own mammogram (overdue)  Disc diet for cholesterol

## 2019-07-10 ENCOUNTER — Other Ambulatory Visit: Payer: Self-pay | Admitting: *Deleted

## 2019-07-10 DIAGNOSIS — Z20822 Contact with and (suspected) exposure to covid-19: Secondary | ICD-10-CM

## 2019-07-11 LAB — NOVEL CORONAVIRUS, NAA: SARS-CoV-2, NAA: NOT DETECTED

## 2019-07-12 ENCOUNTER — Telehealth: Payer: Self-pay | Admitting: Family Medicine

## 2019-07-12 NOTE — Telephone Encounter (Signed)
Calling for covid results; negative, verbalizes understanding.

## 2019-11-08 ENCOUNTER — Ambulatory Visit: Payer: Medicare Other | Admitting: Family Medicine

## 2019-11-09 ENCOUNTER — Encounter: Payer: Self-pay | Admitting: Family Medicine

## 2019-11-09 ENCOUNTER — Other Ambulatory Visit: Payer: Self-pay

## 2019-11-09 ENCOUNTER — Ambulatory Visit (INDEPENDENT_AMBULATORY_CARE_PROVIDER_SITE_OTHER): Payer: Medicare Other | Admitting: Family Medicine

## 2019-11-09 VITALS — BP 142/70 | HR 78 | Temp 96.9°F | Ht 62.5 in | Wt 123.4 lb

## 2019-11-09 DIAGNOSIS — T8090XA Unspecified complication following infusion and therapeutic injection, initial encounter: Secondary | ICD-10-CM | POA: Diagnosis not present

## 2019-11-09 MED ORDER — CEPHALEXIN 500 MG PO CAPS: 500.0000 mg | ORAL_CAPSULE | Freq: Three times a day (TID) | ORAL | 0 refills | Status: DC

## 2019-11-09 NOTE — Patient Instructions (Signed)
You can take claritin 10mg  a day if needed for local itching.  Hold keflex for now.  If you have spreading redness with a fever, then start the antibiotics and call the triage line at [phone].  I'll update Dr. Glori Bickers.  Don't cancel your 2nd vaccine appointment but don't get another dose without taking to use first.  We'll be in touch likely next week.  Take care.  Glad to see you.

## 2019-11-09 NOTE — Progress Notes (Signed)
This visit occurred during the SARS-CoV-2 public health emergency.  Safety protocols were in place, including screening questions prior to the visit, additional usage of staff PPE, and extensive cleaning of exam room while observing appropriate contact time as indicated for disinfecting solutions.  Rash at injection site on R upper arm.  Moderna Covid vaccine done 10/30/19.  1st dose.  No sx at the time.  Only had sx in the last few days.  Then noted redness locally.  Puffy.  Not painful.  Normal ROM. Itches some.  No fevers.    No lip or tongue swelling. No wheeze.    She isn't due for f/u vaccine dose until 11/29/19.   Meds, vitals, and allergies reviewed.   ROS: Per HPI unless specifically indicated in ROS section   nad ncat Neck supple, no stridor. rrr ctab Abdomen soft Extremities well perfused without edema. She has a 5 x 7 cm area of blanching erythema on the right deltoid.  Well demarcated.  No ulceration.  No spreading streaks beyond the border.  Border marked with ink pen.  No fluctuant mass.

## 2019-11-11 DIAGNOSIS — T8090XA Unspecified complication following infusion and therapeutic injection, initial encounter: Secondary | ICD-10-CM | POA: Insufficient documentation

## 2019-11-11 NOTE — Assessment & Plan Note (Signed)
I suspect this is an injection site reaction and not cellulitis.  Options discussed with patient.  I printed off a prescription for Keflex for her to hold in the meantime.  If she has progressive spreading erythema or any other signs of illness, then reasonable to start antibiotics and then call the triage line here at the clinic.  More likely, I suspect she will not need this prescription.    I want to talk to her primary in the meantime to see about options for follow-up Covid vaccination.  I did not tell her to go through with the second dose of moderna vaccine but I did not tell her a final answer about avoiding it.  I want to talk to her PCP in the meantime.  Patient agrees.  She is not going to get the follow-up vaccine unless she hears from [place] first.  She is still okay for outpatient follow-up.  She agrees to plan.

## 2019-11-12 NOTE — Progress Notes (Signed)
Please check on pt and see how her injection reaction is.   There is no steadfast rule re: 2nd covid shot after local reaction but since hers was so pronounced I would probably advise her not to take the 2nd shot (more than likely the first shot gives fairly good immunity anyway)   Will cc to Dr Damita Dunnings who saw her  Thanks

## 2019-11-13 ENCOUNTER — Telehealth: Payer: Self-pay | Admitting: *Deleted

## 2019-11-13 NOTE — Telephone Encounter (Signed)
Pt notified of Dr. Tower's/ Dr. Josefine Class comments regarding getting 2nd covid vaccine   "Please check on pt and see how her injection reaction is.   There is no steadfast rule re: 2nd covid shot after local reaction but since hers was so pronounced I would probably advise her not to take the 2nd shot (more than likely the first shot gives fairly good immunity anyway)"   Pt said she is feeling a lot better, the swelling and redness has resolved. Pt said that's it's not itching anymore either. Pt said she thinks the meds helped a lot and if she doesn't continue to improve or if "anything pops back up" she will let us know but for now she is fine. Pt agreed about not getting 2nd vaccine also  FYI to PCP

## 2019-11-13 NOTE — Progress Notes (Signed)
I agree with Dr. Glori Bickers.  Please get update on patient.  I would defer the 2nd shot.  Thanks.  Elsie Stain

## 2020-01-21 ENCOUNTER — Telehealth: Payer: Self-pay | Admitting: Family Medicine

## 2020-01-21 NOTE — Telephone Encounter (Signed)
Left message for patient to call back and schedule Medicare Annual Wellness Visit (AWV) with Nurse Health Advisor   This should be a telephone visit only.  Last AWV 10/02/18

## 2020-04-22 ENCOUNTER — Telehealth: Payer: Self-pay | Admitting: Family Medicine

## 2020-04-22 DIAGNOSIS — Z0000 Encounter for general adult medical examination without abnormal findings: Secondary | ICD-10-CM

## 2020-04-22 DIAGNOSIS — E559 Vitamin D deficiency, unspecified: Secondary | ICD-10-CM

## 2020-04-22 DIAGNOSIS — E7800 Pure hypercholesterolemia, unspecified: Secondary | ICD-10-CM

## 2020-04-22 DIAGNOSIS — D696 Thrombocytopenia, unspecified: Secondary | ICD-10-CM

## 2020-04-22 NOTE — Telephone Encounter (Signed)
-----   Message from Ellamae Sia sent at 04/08/2020  2:45 PM EDT ----- Regarding: lab orders for Friday,8.27.21 Patient is scheduled for CPX labs, please order future labs, Thanks , Karna Christmas

## 2020-04-25 ENCOUNTER — Other Ambulatory Visit (INDEPENDENT_AMBULATORY_CARE_PROVIDER_SITE_OTHER): Payer: Medicare Other

## 2020-04-25 ENCOUNTER — Other Ambulatory Visit: Payer: Self-pay

## 2020-04-25 DIAGNOSIS — E559 Vitamin D deficiency, unspecified: Secondary | ICD-10-CM | POA: Diagnosis not present

## 2020-04-25 DIAGNOSIS — Z0000 Encounter for general adult medical examination without abnormal findings: Secondary | ICD-10-CM | POA: Diagnosis not present

## 2020-04-25 DIAGNOSIS — D696 Thrombocytopenia, unspecified: Secondary | ICD-10-CM | POA: Diagnosis not present

## 2020-04-25 DIAGNOSIS — E7800 Pure hypercholesterolemia, unspecified: Secondary | ICD-10-CM

## 2020-04-25 LAB — CBC WITH DIFFERENTIAL/PLATELET
Basophils Absolute: 0 10*3/uL (ref 0.0–0.1)
Basophils Relative: 0.2 % (ref 0.0–3.0)
Eosinophils Absolute: 0.1 10*3/uL (ref 0.0–0.7)
Eosinophils Relative: 1.7 % (ref 0.0–5.0)
HCT: 40.8 % (ref 36.0–46.0)
Hemoglobin: 13.4 g/dL (ref 12.0–15.0)
Lymphocytes Relative: 30.5 % (ref 12.0–46.0)
Lymphs Abs: 1.8 10*3/uL (ref 0.7–4.0)
MCHC: 33 g/dL (ref 30.0–36.0)
MCV: 88.8 fl (ref 78.0–100.0)
Monocytes Absolute: 0.5 10*3/uL (ref 0.1–1.0)
Monocytes Relative: 9 % (ref 3.0–12.0)
Neutro Abs: 3.4 10*3/uL (ref 1.4–7.7)
Neutrophils Relative %: 58.6 % (ref 43.0–77.0)
Platelets: 131 10*3/uL — ABNORMAL LOW (ref 150.0–400.0)
RBC: 4.59 Mil/uL (ref 3.87–5.11)
RDW: 13.7 % (ref 11.5–15.5)
WBC: 5.8 10*3/uL (ref 4.0–10.5)

## 2020-04-25 LAB — COMPREHENSIVE METABOLIC PANEL
ALT: 15 U/L (ref 0–35)
AST: 17 U/L (ref 0–37)
Albumin: 4.3 g/dL (ref 3.5–5.2)
Alkaline Phosphatase: 54 U/L (ref 39–117)
BUN: 20 mg/dL (ref 6–23)
CO2: 29 mEq/L (ref 19–32)
Calcium: 9.6 mg/dL (ref 8.4–10.5)
Chloride: 104 mEq/L (ref 96–112)
Creatinine, Ser: 1.13 mg/dL (ref 0.40–1.20)
GFR: 47.07 mL/min — ABNORMAL LOW (ref >60.00)
Glucose, Bld: 88 mg/dL (ref 70–99)
Potassium: 4.7 mEq/L (ref 3.5–5.1)
Sodium: 139 mEq/L (ref 135–145)
Total Bilirubin: 0.5 mg/dL (ref 0.2–1.2)
Total Protein: 7.1 g/dL (ref 6.0–8.3)

## 2020-04-25 LAB — LIPID PANEL
Cholesterol: 192 mg/dL (ref 0–200)
HDL: 68.2 mg/dL (ref >39.00)
LDL Cholesterol: 108 mg/dL — ABNORMAL HIGH (ref 0–99)
NonHDL: 123.34
Total CHOL/HDL Ratio: 3
Triglycerides: 75 mg/dL (ref 0.0–149.0)
VLDL: 15 mg/dL (ref 0.0–40.0)

## 2020-04-25 LAB — VITAMIN D 25 HYDROXY (VIT D DEFICIENCY, FRACTURES): VITD: 79.33 ng/mL (ref 30.00–100.00)

## 2020-04-25 LAB — TSH: TSH: 1.63 u[IU]/mL (ref 0.35–4.50)

## 2020-04-29 ENCOUNTER — Other Ambulatory Visit: Payer: Self-pay

## 2020-04-29 ENCOUNTER — Ambulatory Visit (INDEPENDENT_AMBULATORY_CARE_PROVIDER_SITE_OTHER): Payer: Medicare Other

## 2020-04-29 DIAGNOSIS — Z0000 Encounter for general adult medical examination without abnormal findings: Secondary | ICD-10-CM

## 2020-04-29 NOTE — Progress Notes (Signed)
Subjective:   Angelica Rivers is a 74 y.o. female who presents for Medicare Annual (Subsequent) preventive examination.  Review of Systems: N/A      I connected with the patient today by telephone and verified that I am speaking with the correct person using two identifiers. Location patient: home Location nurse: work Persons participating in the telephone visit: patient, nurse.   I discussed the limitations, risks, security and privacy concerns of performing an evaluation and management service by telephone and the availability of in person appointments. I also discussed with the patient that there may be a patient responsible charge related to this service. The patient expressed understanding and verbally consented to this telephonic visit.        Cardiac Risk Factors include: advanced age (>25men, >27 women);Other (see comment), Risk factor comments: hyperlipidemia     Objective:    Today's Vitals   There is no height or weight on file to calculate BMI.  Advanced Directives 04/29/2020 10/02/2018 09/27/2017 11/10/2016 09/23/2016 01/24/2016  Does Patient Have a Medical Advance Directive? No No No No No No  Would patient like information on creating a medical advance directive? No - Patient declined No - Patient declined No - Patient declined - - No - patient declined information    Current Medications (verified) Outpatient Encounter Medications as of 04/29/2020  Medication Sig  . aspirin 81 MG chewable tablet Chew 81 mg by mouth every other day.   . Cholecalciferol (VITAMIN D) 2000 UNITS CAPS Take 2 capsules by mouth daily.  . Flaxseed, Linseed, (FLAX SEED OIL) 1000 MG CAPS Take 1 capsule by mouth daily.   Marland Kitchen amoxicillin (AMOXIL) 500 MG capsule TAKE 4 CAPSULES BY MOUTH BEFORE DENTAL APPOINTMENT (Patient not taking: Reported on 04/29/2020)  . cephALEXin (KEFLEX) 500 MG capsule Take 1 capsule (500 mg total) by mouth 3 (three) times daily. (Patient not taking: Reported on 04/29/2020)    No facility-administered encounter medications on file as of 04/29/2020.    Allergies (verified) Neosporin [neomycin-bacitracin zn-polymyx] and Shingrix [zoster vac recomb adjuvanted]   History: Past Medical History:  Diagnosis Date  . Anemia   . History of repair of mitral valve    proph for dentist  . Nephrolithiasis    Hx of  . Osteopenia   . Osteoporosis    fosamax for over 5 yrs.  . Vaginal cyst    stable   Past Surgical History:  Procedure Laterality Date  . CARDIAC SURGERY  1995   mitral valve repair carpenters ring  . COLONOSCOPY    . HEMORRHOID SURGERY    . lithotripsy for kidney stones    . MITRAL VALVE REPAIR  2004  . ruptured ovarian cyst surgery    . skin graft dog bite  2010  . TONSILLECTOMY     Family History  Problem Relation Age of Onset  . Colon cancer Mother   . Diabetes Brother   . Esophageal cancer Neg Hx   . Rectal cancer Neg Hx   . Stomach cancer Neg Hx    Social History   Socioeconomic History  . Marital status: Married    Spouse name: Not on file  . Number of children: Not on file  . Years of education: Not on file  . Highest education level: Not on file  Occupational History  . Not on file  Tobacco Use  . Smoking status: Never Smoker  . Smokeless tobacco: Never Used  Vaping Use  . Vaping Use: Never used  Substance and Sexual Activity  . Alcohol use: No    Alcohol/week: 0.0 standard drinks  . Drug use: No  . Sexual activity: Never  Other Topics Concern  . Not on file  Social History Narrative  . Not on file   Social Determinants of Health   Financial Resource Strain: Low Risk   . Difficulty of Paying Living Expenses: Not hard at all  Food Insecurity: No Food Insecurity  . Worried About Charity fundraiser in the Last Year: Never true  . Ran Out of Food in the Last Year: Never true  Transportation Needs: No Transportation Needs  . Lack of Transportation (Medical): No  . Lack of Transportation (Non-Medical): No   Physical Activity: Inactive  . Days of Exercise per Week: 0 days  . Minutes of Exercise per Session: 0 min  Stress: No Stress Concern Present  . Feeling of Stress : Not at all  Social Connections:   . Frequency of Communication with Friends and Family: Not on file  . Frequency of Social Gatherings with Friends and Family: Not on file  . Attends Religious Services: Not on file  . Active Member of Clubs or Organizations: Not on file  . Attends Archivist Meetings: Not on file  . Marital Status: Not on file    Tobacco Counseling Counseling given: Not Answered   Clinical Intake:  Pre-visit preparation completed: Yes  Pain : No/denies pain     Nutritional Risks: None Diabetes: No  How often do you need to have someone help you when you read instructions, pamphlets, or other written materials from your doctor or pharmacy?: 1 - Never What is the last grade level you completed in school?: 12th  Diabetic: No Nutrition Risk Assessment:  Has the patient had any N/V/D within the last 2 months?  No  Does the patient have any non-healing wounds?  No  Has the patient had any unintentional weight loss or weight gain?  No   Diabetes:  Is the patient diabetic?  No  If diabetic, was a CBG obtained today?  N/A Did the patient bring in their glucometer from home?  N/A How often do you monitor your CBG's? N/A.   Financial Strains and Diabetes Management:  Are you having any financial strains with the device, your supplies or your medication? N/A.  Does the patient want to be seen by Chronic Care Management for management of their diabetes?  N/A Would the patient like to be referred to a Nutritionist or for Diabetic Management?  N/A    Interpreter Needed?: No  Information entered by :: CJohnson, LPN   Activities of Daily Living In your present state of health, do you have any difficulty performing the following activities: 04/29/2020  Hearing? N  Vision? N   Difficulty concentrating or making decisions? N  Walking or climbing stairs? N  Dressing or bathing? N  Doing errands, shopping? N  Preparing Food and eating ? N  Using the Toilet? N  In the past six months, have you accidently leaked urine? N  Do you have problems with loss of bowel control? N  Managing your Medications? N  Managing your Finances? N  Housekeeping or managing your Housekeeping? N  Some recent data might be hidden    Patient Care Team: Tower, Wynelle Fanny, MD as PCP - General  Indicate any recent Medical Services you may have received from other than [facility] providers in the past year (date may be approximate).  Assessment:   This is a routine wellness examination for Emmett.  Hearing/Vision screen  Hearing Screening   125Hz  250Hz  500Hz  1000Hz  2000Hz  3000Hz  4000Hz  6000Hz  8000Hz   Right ear:           Left ear:           Vision Screening Comments: Patient gets annual eye exams   Dietary issues and exercise activities discussed: Current Exercise Habits: The patient does not participate in regular exercise at present, Exercise limited by: None identified  Goals    . DIET - INCREASE WATER INTAKE     Starting 10/02/2018, I will continue to drink at least 6-8 glasses of water daily.     . Patient Stated     04/29/2020, I will continue to maintain and continue medications as prescribed.       Depression Screen PHQ 2/9 Scores 04/29/2020 10/02/2018 09/27/2017 09/23/2016 09/20/2016 09/15/2015 12/17/2013  PHQ - 2 Score 0 0 0 0 0 0 0  PHQ- 9 Score 0 0 0 - - - -    Fall Risk Fall Risk  04/29/2020 10/02/2018 09/27/2017 09/23/2016 09/20/2016  Falls in the past year? 0 0 Yes No Yes  Comment - - pt fell over cord in yard; caused sprain to left thumb - -  Number falls in past yr: 0 - 1 - -  Injury with Fall? 0 - Yes - -  Risk for fall due to : No Fall Risks - - - -  Follow up Falls evaluation completed;Falls prevention discussed - - - -    Any stairs in or around the home? Yes  If  so, are there any without handrails? No  Home free of loose throw rugs in walkways, pet beds, electrical cords, etc? Yes  Adequate lighting in your home to reduce risk of falls? Yes   ASSISTIVE DEVICES UTILIZED TO PREVENT FALLS:  Life alert? No  Use of a cane, walker or w/c? No  Grab bars in the bathroom? No  Shower chair or bench in shower? No  Elevated toilet seat or a handicapped toilet? No   TIMED UP AND GO:  Was the test performed? N/A, telephonic visit.    Cognitive Function: MMSE - Mini Mental State Exam 04/29/2020 10/02/2018 09/27/2017 09/23/2016  Orientation to time 5 5 5 5   Orientation to Place 5 5 5 5   Registration 3 3 3 3   Attention/ Calculation 5 0 0 0  Recall 3 3 3 3   Language- name 2 objects - 0 0 0  Language- repeat 1 1 1 1   Language- follow 3 step command - 3 3 3   Language- read & follow direction - 0 0 0  Write a sentence - 0 0 0  Copy design - 0 0 0  Total score - 20 20 20   Mini Cog  Mini-Cog screen was completed. Maximum score is 22. A value of 0 denotes this part of the MMSE was not completed or the patient failed this part of the Mini-Cog screening.       Immunizations Immunization History  Administered Date(s) Administered  . 19-influenza Whole 06/14/2015  . Fluad Quad(high Dose 65+) 05/14/2019  . Influenza Whole 09/08/2006  . Influenza, High Dose Seasonal PF 06/21/2014, 06/23/2016, 06/14/2017, 06/11/2018  . Influenza-Unspecified 05/30/2013  . Moderna SARS-COVID-2 Vaccination 10/30/2019  . Pneumococcal Conjugate-13 12/17/2013  . Pneumococcal Polysaccharide-23 10/13/2011  . Td 04/20/2002, 02/27/2009  . Tdap 12/25/2012, 03/06/2014  . Zoster 01/24/2013  . Zoster Recombinat (Shingrix) 01/05/2017  TDAP status: Up to date Flu Vaccine status:due, will get at pharmacy Pneumococcal vaccine status: Up to date Covid-19 vaccine status: declined # 2  Qualifies for Shingles Vaccine? Yes   Zostavax completed Yes   Shingrix Completed:  allergic  Screening Tests Health Maintenance  Topic Date Due  . MAMMOGRAM  05/23/2018  . INFLUENZA VACCINE  03/30/2020  . COVID-19 Vaccine (2 - Moderna 2-dose series) 05/15/2020 (Originally 11/27/2019)  . DEXA SCAN  11/29/2020  . COLONOSCOPY  11/10/2021  . TETANUS/TDAP  03/06/2024  . Hepatitis C Screening  Completed  . PNA vac Low Risk Adult  Completed  . PAP SMEAR-Modifier  Discontinued    Health Maintenance  Health Maintenance Due  Topic Date Due  . MAMMOGRAM  05/23/2018  . INFLUENZA VACCINE  03/30/2020    Colorectal cancer screening: Completed 11/10/2016. Repeat every 5 years Mammogram status: due, will schedule appointment soon.  Bone Density status: Completed 11/29/2017. Results reflect: Bone density results: OSTEOPENIA. Repeat every 3 years.  Lung Cancer Screening: (Low Dose CT Chest recommended if Age 59-80 years, 30 pack-year currently smoking OR have quit w/in 15 years.) does not qualify.    Additional Screening:  Hepatitis C Screening: does qualify; Completed 09/23/2016  Vision Screening: Recommended annual ophthalmology exams for early detection of glaucoma and other disorders of the eye. Is the patient up to date with their annual eye exam?  No , will schedule appointment soon Who is the provider or what is the name of the office in which the patient attends annual eye exams? [facility] If pt is not established with a provider, would they like to be referred to a provider to establish care? No .   Dental Screening: Recommended annual dental exams for proper oral hygiene  Community Resource Referral / Chronic Care Management: CRR required this visit?  No   CCM required this visit?  No      Plan:     I have personally reviewed and noted the following in the patient's chart:   . Medical and social history . Use of alcohol, tobacco or illicit drugs  . Current medications and supplements . Functional ability and status . Nutritional status . Physical  activity . Advanced directives . List of other physicians . Hospitalizations, surgeries, and ER visits in previous 12 months . Vitals . Screenings to include cognitive, depression, and falls . Referrals and appointments  In addition, I have reviewed and discussed with patient certain preventive protocols, quality metrics, and best practice recommendations. A written personalized care plan for preventive services as well as general preventive health recommendations were provided to patient.   Due to this being a telephonic visit, the after visit summary with patients personalized plan was offered to patient via mail or my-chart. Patient preferred to pick up at office at next visit.   Andrez Grime, LPN   0/32/1224

## 2020-04-29 NOTE — Progress Notes (Signed)
PCP notes:  Health Maintenance: Flu- due COVID #2- declined Shingrix- allergic Mammogram- due   Abnormal Screenings: none   Patient concerns: Cyst in genital area. Clear drainage noted.    Nurse concerns: none   Next PCP appt.: 04/30/2020 @ 10:30 am

## 2020-04-29 NOTE — Patient Instructions (Addendum)
Angelica Rivers , Thank you for taking time to come for your Medicare Wellness Visit. I appreciate your ongoing commitment to your health goals. Please review the following plan we discussed and let me know if I can assist you in the future.   Screening recommendations/referrals: Colonoscopy: Up to date, completed 11/10/2016, due 10/2021 Mammogram: due, will schedule this soon  Bone Density: Up to date, completed 11/29/2017, due 11/2020 Recommended yearly ophthalmology/optometry visit for glaucoma screening and checkup Recommended yearly dental visit for hygiene and checkup  Vaccinations: Influenza vaccine: due, will get at pharmacy   Pneumococcal vaccine: Completed series Tdap vaccine: Up to date, completed 03/06/2014, due 02/2024 Shingles vaccine: allergic   Covid-19: declined # 2  Advanced directives: Advance directive discussed with you today. Even though you declined this today please call our office should you change your mind and we can give you the proper paperwork for you to fill out.  Conditions/risks identified: hyperlipidemia  Next appointment: Follow up in one year for your annual wellness visit    Preventive Care 65 Years and Older, Female Preventive care refers to lifestyle choices and visits with your health care provider that can promote health and wellness. What does preventive care include?  A yearly physical exam. This is also called an annual well check.  Dental exams once or twice a year.  Routine eye exams. Ask your health care provider how often you should have your eyes checked.  Personal lifestyle choices, including:  Daily care of your teeth and gums.  Regular physical activity.  Eating a healthy diet.  Avoiding tobacco and drug use.  Limiting alcohol use.  Practicing safe sex.  Taking low-dose aspirin every day.  Taking vitamin and mineral supplements as recommended by your health care provider. What happens during an annual well check? The services  and screenings done by your health care provider during your annual well check will depend on your age, overall health, lifestyle risk factors, and family history of disease. Counseling  Your health care provider may ask you questions about your:  Alcohol use.  Tobacco use.  Drug use.  Emotional well-being.  Home and relationship well-being.  Sexual activity.  Eating habits.  History of falls.  Memory and ability to understand (cognition).  Work and work Statistician.  Reproductive health. Screening  You may have the following tests or measurements:  Height, weight, and BMI.  Blood pressure.  Lipid and cholesterol levels. These may be checked every 5 years, or more frequently if you are over 78 years old.  Skin check.  Lung cancer screening. You may have this screening every year starting at age 59 if you have a 30-pack-year history of smoking and currently smoke or have quit within the past 15 years.  Fecal occult blood test (FOBT) of the stool. You may have this test every year starting at age 40.  Flexible sigmoidoscopy or colonoscopy. You may have a sigmoidoscopy every 5 years or a colonoscopy every 10 years starting at age 45.  Hepatitis C blood test.  Hepatitis B blood test.  Sexually transmitted disease (STD) testing.  Diabetes screening. This is done by checking your blood sugar (glucose) after you have not eaten for a while (fasting). You may have this done every 1-3 years.  Bone density scan. This is done to screen for osteoporosis. You may have this done starting at age 11.  Mammogram. This may be done every 1-2 years. Talk to your health care provider about how often you should have  regular mammograms. Talk with your health care provider about your test results, treatment options, and if necessary, the need for more tests. Vaccines  Your health care provider may recommend certain vaccines, such as:  Influenza vaccine. This is recommended every  year.  Tetanus, diphtheria, and acellular pertussis (Tdap, Td) vaccine. You may need a Td booster every 10 years.  Zoster vaccine. You may need this after age 35.  Pneumococcal 13-valent conjugate (PCV13) vaccine. One dose is recommended after age 26.  Pneumococcal polysaccharide (PPSV23) vaccine. One dose is recommended after age 76. Talk to your health care provider about which screenings and vaccines you need and how often you need them. This information is not intended to replace advice given to you by your health care provider. Make sure you discuss any questions you have with your health care provider. Document Released: 09/12/2015 Document Revised: 05/05/2016 Document Reviewed: 06/17/2015 Elsevier Interactive Patient Education  2017 [place] Prevention in the Home Falls can cause injuries. They can happen to people of all ages. There are many things you can do to make your home safe and to help prevent falls. What can I do on the outside of my home?  Regularly fix the edges of walkways and driveways and fix any cracks.  Remove anything that might make you trip as you walk through a door, such as a raised step or threshold.  Trim any bushes or trees on the path to your home.  Use bright outdoor lighting.  Clear any walking paths of anything that might make someone trip, such as rocks or tools.  Regularly check to see if handrails are loose or broken. Make sure that both sides of any steps have handrails.  Any raised decks and porches should have guardrails on the edges.  Have any leaves, snow, or ice cleared regularly.  Use sand or salt on walking paths during winter.  Clean up any spills in your garage right away. This includes oil or grease spills. What can I do in the bathroom?  Use night lights.  Install grab bars by the toilet and in the tub and shower. Do not use towel bars as grab bars.  Use non-skid mats or decals in the tub or shower.  If you  need to sit down in the shower, use a plastic, non-slip stool.  Keep the floor dry. Clean up any water that spills on the floor as soon as it happens.  Remove soap buildup in the tub or shower regularly.  Attach bath mats securely with double-sided non-slip rug tape.  Do not have throw rugs and other things on the floor that can make you trip. What can I do in the bedroom?  Use night lights.  Make sure that you have a light by your bed that is easy to reach.  Do not use any sheets or blankets that are too big for your bed. They should not hang down onto the floor.  Have a firm chair that has side arms. You can use this for support while you get dressed.  Do not have throw rugs and other things on the floor that can make you trip. What can I do in the kitchen?  Clean up any spills right away.  Avoid walking on wet floors.  Keep items that you use a lot in easy-to-reach places.  If you need to reach something above you, use a strong step stool that has a grab bar.  Keep electrical cords out of the  way.  Do not use floor polish or wax that makes floors slippery. If you must use wax, use non-skid floor wax.  Do not have throw rugs and other things on the floor that can make you trip. What can I do with my stairs?  Do not leave any items on the stairs.  Make sure that there are handrails on both sides of the stairs and use them. Fix handrails that are broken or loose. Make sure that handrails are as long as the stairways.  Check any carpeting to make sure that it is firmly attached to the stairs. Fix any carpet that is loose or worn.  Avoid having throw rugs at the top or bottom of the stairs. If you do have throw rugs, attach them to the floor with carpet tape.  Make sure that you have a light switch at the top of the stairs and the bottom of the stairs. If you do not have them, ask someone to add them for you. What else can I do to help prevent falls?  Wear shoes  that:  Do not have high heels.  Have rubber bottoms.  Are comfortable and fit you well.  Are closed at the toe. Do not wear sandals.  If you use a stepladder:  Make sure that it is fully opened. Do not climb a closed stepladder.  Make sure that both sides of the stepladder are locked into place.  Ask someone to hold it for you, if possible.  Clearly mark and make sure that you can see:  Any grab bars or handrails.  First and last steps.  Where the edge of each step is.  Use tools that help you move around (mobility aids) if they are needed. These include:  Canes.  Walkers.  Scooters.  Crutches.  Turn on the lights when you go into a dark area. Replace any light bulbs as soon as they burn out.  Set up your furniture so you have a clear path. Avoid moving your furniture around.  If any of your floors are uneven, fix them.  If there are any pets around you, be aware of where they are.  Review your medicines with your doctor. Some medicines can make you feel dizzy. This can increase your chance of falling. Ask your doctor what other things that you can do to help prevent falls. This information is not intended to replace advice given to you by your health care provider. Make sure you discuss any questions you have with your health care provider. Document Released: 06/12/2009 Document Revised: 01/22/2016 Document Reviewed: 09/20/2014 Elsevier Interactive Patient Education  2017 [employer].

## 2020-04-30 ENCOUNTER — Other Ambulatory Visit: Payer: Self-pay

## 2020-04-30 ENCOUNTER — Ambulatory Visit (INDEPENDENT_AMBULATORY_CARE_PROVIDER_SITE_OTHER): Payer: Medicare Other | Admitting: Family Medicine

## 2020-04-30 ENCOUNTER — Encounter: Payer: Self-pay | Admitting: Family Medicine

## 2020-04-30 VITALS — BP 142/80 | HR 74 | Temp 96.9°F | Ht 61.25 in | Wt 125.1 lb

## 2020-04-30 DIAGNOSIS — D696 Thrombocytopenia, unspecified: Secondary | ICD-10-CM

## 2020-04-30 DIAGNOSIS — M8589 Other specified disorders of bone density and structure, multiple sites: Secondary | ICD-10-CM | POA: Diagnosis not present

## 2020-04-30 DIAGNOSIS — E559 Vitamin D deficiency, unspecified: Secondary | ICD-10-CM | POA: Diagnosis not present

## 2020-04-30 DIAGNOSIS — Z0000 Encounter for general adult medical examination without abnormal findings: Secondary | ICD-10-CM | POA: Diagnosis not present

## 2020-04-30 DIAGNOSIS — N814 Uterovaginal prolapse, unspecified: Secondary | ICD-10-CM | POA: Insufficient documentation

## 2020-04-30 DIAGNOSIS — N898 Other specified noninflammatory disorders of vagina: Secondary | ICD-10-CM

## 2020-04-30 DIAGNOSIS — E7800 Pure hypercholesterolemia, unspecified: Secondary | ICD-10-CM

## 2020-04-30 DIAGNOSIS — E2839 Other primary ovarian failure: Secondary | ICD-10-CM

## 2020-04-30 MED ORDER — AMOXICILLIN 500 MG PO CAPS
ORAL_CAPSULE | ORAL | 3 refills | Status: DC
Start: 2020-04-30 — End: 2021-09-11

## 2020-04-30 NOTE — Assessment & Plan Note (Signed)
This has started to bother her/sometimes bleed Ref to gyn for eval

## 2020-04-30 NOTE — Assessment & Plan Note (Signed)
Disc goals for lipids and reasons to control them Rev last labs with pt Rev low sat fat diet in detail LDL is downto 108 with high HDL

## 2020-04-30 NOTE — Assessment & Plan Note (Signed)
dexa 4/19-she plans to schedule one with her mammogram  No falls or fractures  Taking D , level is tx  Good exercise

## 2020-04-30 NOTE — Assessment & Plan Note (Signed)
Reviewed health habits including diet and exercise and skin cancer prevention Reviewed appropriate screening tests for age  Also reviewed health mt list, fam hx and immunization status , as well as social and family history   See HPI amw rev Labs rev  dexa ordered Pt will schedule her own mammogram  utd colonoscopy Dental exam upcoming/routine imm for covid-severe local rxn unsure if she will get 2nd shot  Allergic to shingrix-rash  Plans flu shot in the fall

## 2020-04-30 NOTE — Progress Notes (Signed)
Subjective:    Patient ID: Angelica Rivers, female    DOB: 18-Aug-1946, 74 y.o.   MRN: [id number]  This visit occurred during the SARS-CoV-2 public health emergency.  Safety protocols were in place, including screening questions prior to the visit, additional usage of staff PPE, and extensive cleaning of exam room while observing appropriate contact time as indicated for disinfecting solutions.    HPI Here for health maintenance exam and to review chronic medical problems    Wt Readings from Last 3 Encounters:  04/30/20 125 lb 2 oz (56.8 kg)  11/09/19 123 lb 6 oz (56 kg)  10/11/18 123 lb 12 oz (56.1 kg)   23.45 kg/m  Watching grand kids  Takes care of 38 yo mother   Tries to take care of herself    Had amw yesterday  Needs abx for dental visit   Due for mammogram (last was 9/18)-she will call and set that up  Self breast exam -no lumps   Had first shingrix vaccine- was allergic  Had first moderna covid imm  Very big local reaction-unsure if she will get the 2nd  Had a visit with Dr Damita Dunnings   Flu shot -plans to get in the fall   dexa 4/19 -osteopenia- will schedule with mammogram  Falls -none Fractures -none Supplements -taking vit D  Vit D level 79 Exercise - a lot of stairs and housework and lifting   Colonoscopy 3/18 with 5 y recall Mother had colon cancer   Hyperlipidemia Lab Results  Component Value Date   CHOL 192 04/25/2020   CHOL 196 10/02/2018   CHOL 189 09/27/2017   Lab Results  Component Value Date   HDL 68.20 04/25/2020   HDL 66.10 10/02/2018   HDL 68.20 09/27/2017   Lab Results  Component Value Date   LDLCALC 108 (H) 04/25/2020   LDLCALC 117 (H) 10/02/2018   LDLCALC 106 (H) 09/27/2017   Lab Results  Component Value Date   TRIG 75.0 04/25/2020   TRIG 64.0 10/02/2018   TRIG 74.0 09/27/2017   Lab Results  Component Value Date   CHOLHDL 3 04/25/2020   CHOLHDL 3 10/02/2018   CHOLHDL 3 09/27/2017   Lab Results  Component Value  Date   LDLDIRECT 117.7 10/06/2011   LDLDIRECT 127.8 09/22/2010   LDLDIRECT 120.8 09/12/2008   improved LDL  Good HDL  Eating healthy     H/o low platelets Lab Results  Component Value Date   WBC 5.8 04/25/2020   HGB 13.4 04/25/2020   HCT 40.8 04/25/2020   MCV 88.8 04/25/2020   PLT 131.0 (L) 04/25/2020   Stable - good   Lab Results  Component Value Date   WBC 5.8 04/25/2020   HGB 13.4 04/25/2020   HCT 40.8 04/25/2020   MCV 88.8 04/25/2020   PLT 131.0 (L) 04/25/2020   Lab Results  Component Value Date   CREATININE 1.13 04/25/2020   BUN 20 04/25/2020   NA 139 04/25/2020   K 4.7 04/25/2020   CL 104 04/25/2020   CO2 29 04/25/2020   Lab Results  Component Value Date   ALT 15 04/25/2020   AST 17 04/25/2020   ALKPHOS 54 04/25/2020   BILITOT 0.5 04/25/2020   Lab Results  Component Value Date   TSH 1.63 04/25/2020    Patient Active Problem List   Diagnosis Date Noted  . Vaginal cyst 04/30/2020  . Injection site reaction 11/11/2019  . Colitis 04/12/2018  . Estrogen deficiency 09/15/2015  .  Colon cancer screening 12/17/2013  . Encounter for Medicare annual wellness exam 12/08/2013  . Routine general medical examination at a health care facility 12/16/2012  . Gynecological examination 10/13/2011  . Vitamin D deficiency 09/23/2009  . Thrombocytopenia ([place]) 09/23/2009  . Hyperlipidemia 09/12/2008  . FIBROCYSTIC BREAST DISEASE 09/08/2007  . Osteopenia 09/08/2007  . NEPHROLITHIASIS, HX OF 09/08/2007   Past Medical History:  Diagnosis Date  . Anemia   . History of repair of mitral valve    proph for dentist  . Nephrolithiasis    Hx of  . Osteopenia   . Osteoporosis    fosamax for over 5 yrs.  . Vaginal cyst    stable   Past Surgical History:  Procedure Laterality Date  . CARDIAC SURGERY  1995   mitral valve repair carpenters ring  . COLONOSCOPY    . HEMORRHOID SURGERY    . lithotripsy for kidney stones    . MITRAL VALVE REPAIR  2004  . ruptured  ovarian cyst surgery    . skin graft dog bite  2010  . TONSILLECTOMY     Social History   Tobacco Use  . Smoking status: Never Smoker  . Smokeless tobacco: Never Used  Vaping Use  . Vaping Use: Never used  Substance Use Topics  . Alcohol use: No    Alcohol/week: 0.0 standard drinks  . Drug use: No   Family History  Problem Relation Age of Onset  . Colon cancer Mother   . Diabetes Brother   . Esophageal cancer Neg Hx   . Rectal cancer Neg Hx   . Stomach cancer Neg Hx    Allergies  Allergen Reactions  . Neosporin [Neomycin-Bacitracin Zn-Polymyx] Other (See Comments)    Festers up  . Shingrix [Zoster Vac Recomb Adjuvanted]     Rash    Current Outpatient Medications on File Prior to Visit  Medication Sig Dispense Refill  . aspirin 81 MG chewable tablet Chew 81 mg by mouth every other day.     . Cholecalciferol (VITAMIN D) 2000 UNITS CAPS Take 2 capsules by mouth daily.    . Flaxseed, Linseed, (FLAX SEED OIL) 1000 MG CAPS Take 1 capsule by mouth daily.      No current facility-administered medications on file prior to visit.    Review of Systems  Constitutional: Negative for activity change, appetite change, fatigue, fever and unexpected weight change.  HENT: Negative for congestion, ear pain, rhinorrhea, sinus pressure and sore throat.   Eyes: Negative for pain, redness and visual disturbance.  Respiratory: Negative for cough, shortness of breath and wheezing.   Cardiovascular: Negative for chest pain and palpitations.  Gastrointestinal: Negative for abdominal pain, blood in stool, constipation and diarrhea.  Endocrine: Negative for polydipsia and polyuria.  Genitourinary: Positive for vaginal pain. Negative for dysuria, frequency and urgency.  Musculoskeletal: Negative for arthralgias, back pain and myalgias.  Skin: Negative for pallor and rash.  Allergic/Immunologic: Negative for environmental allergies.  Neurological: Negative for dizziness, syncope and headaches.    Hematological: Negative for adenopathy. Does not bruise/bleed easily.  Psychiatric/Behavioral: Negative for decreased concentration and dysphoric mood. The patient is not nervous/anxious.        Objective:   Physical Exam Constitutional:      General: She is not in acute distress.    Appearance: Normal appearance. She is well-developed and normal weight. She is not ill-appearing or diaphoretic.  HENT:     Head: Normocephalic and atraumatic.     Right Ear: Tympanic membrane,  ear canal and external ear normal.     Left Ear: Tympanic membrane, ear canal and external ear normal.     Nose: Nose normal. No congestion.     Mouth/Throat:     Mouth: Mucous membranes are moist.     Pharynx: Oropharynx is clear. No posterior oropharyngeal erythema.  Eyes:     General: No scleral icterus.    Extraocular Movements: Extraocular movements intact.     Conjunctiva/sclera: Conjunctivae normal.     Pupils: Pupils are equal, round, and reactive to light.  Neck:     Thyroid: No thyromegaly.     Vascular: No carotid bruit or JVD.  Cardiovascular:     Rate and Rhythm: Normal rate and regular rhythm.     Pulses: Normal pulses.     Heart sounds: Normal heart sounds. No gallop.   Pulmonary:     Effort: Pulmonary effort is normal. No respiratory distress.     Breath sounds: Normal breath sounds. No wheezing.     Comments: Good air exch Chest:     Chest wall: No tenderness.  Abdominal:     General: Bowel sounds are normal. There is no distension or abdominal bruit.     Palpations: Abdomen is soft. There is no mass.     Tenderness: There is no abdominal tenderness.     Hernia: No hernia is present.  Genitourinary:    Comments: Breast exam: No mass, nodules, thickening, tenderness, bulging, retraction, inflamation, nipple discharge or skin changes noted.  No axillary or clavicular LA.     Musculoskeletal:        General: No tenderness. Normal range of motion.     Cervical back: Normal range of  motion and neck supple. No rigidity. No muscular tenderness.     Right lower leg: No edema.     Left lower leg: No edema.     Comments: Mild kyphosis   Lymphadenopathy:     Cervical: No cervical adenopathy.  Skin:    General: Skin is warm and dry.     Coloration: Skin is not pale.     Findings: No erythema or rash.     Comments: Fair complexion  Some sk and angiomas  Neurological:     Mental Status: She is alert. Mental status is at baseline.     Cranial Nerves: No cranial nerve deficit.     Motor: No abnormal muscle tone.     Coordination: Coordination normal.     Gait: Gait normal.     Deep Tendon Reflexes: Reflexes are normal and symmetric. Reflexes normal.  Psychiatric:        Mood and Affect: Mood normal.        Cognition and Memory: Cognition and memory normal.           Assessment & Plan:   Problem List Items Addressed This Visit      Musculoskeletal and Integument   Osteopenia    dexa 4/19-she plans to schedule one with her mammogram  No falls or fractures  Taking D , level is tx  Good exercise         Genitourinary   Vaginal cyst    This has started to bother her/sometimes bleed Ref to gyn for eval       Relevant Orders   Ambulatory referral to Gynecology     Other   Vitamin D deficiency    D level in 31s Vitamin D level is therapeutic with current supplementation Disc importance of this  to bone and overall health       Hyperlipidemia    Disc goals for lipids and reasons to control them Rev last labs with pt Rev low sat fat diet in detail LDL is downto 108 with high HDL      Thrombocytopenia ([facility])    Stable platelet ct in 130s No bleeding/bruising      Routine general medical examination at a health care facility - Primary    Reviewed health habits including diet and exercise and skin cancer prevention Reviewed appropriate screening tests for age  Also reviewed health mt list, fam hx and immunization status , as well as social and  family history   See HPI amw rev Labs rev  dexa ordered Pt will schedule her own mammogram  utd colonoscopy Dental exam upcoming/routine imm for covid-severe local rxn unsure if she will get 2nd shot  Allergic to shingrix-rash  Plans flu shot in the fall        Estrogen deficiency   Relevant Orders   DG Bone Density

## 2020-04-30 NOTE — Assessment & Plan Note (Signed)
Stable platelet ct in 130s No bleeding/bruising

## 2020-04-30 NOTE — Assessment & Plan Note (Signed)
D level in 70s Vitamin D level is therapeutic with current supplementation Disc importance of this to bone and overall health

## 2020-04-30 NOTE — Patient Instructions (Addendum)
Don't forget to schedule your mammogram   Our office will call you regarding a gyn referral for the cyst   Keep taking good care of yourself   Stay safe

## 2020-05-14 ENCOUNTER — Ambulatory Visit (INDEPENDENT_AMBULATORY_CARE_PROVIDER_SITE_OTHER): Payer: Medicare Other | Admitting: Family Medicine

## 2020-05-14 ENCOUNTER — Encounter: Payer: Self-pay | Admitting: Family Medicine

## 2020-05-14 ENCOUNTER — Other Ambulatory Visit: Payer: Self-pay

## 2020-05-14 VITALS — BP 123/77 | HR 73 | Ht 65.0 in | Wt 125.0 lb

## 2020-05-14 DIAGNOSIS — N814 Uterovaginal prolapse, unspecified: Secondary | ICD-10-CM | POA: Diagnosis not present

## 2020-05-14 DIAGNOSIS — N950 Postmenopausal bleeding: Secondary | ICD-10-CM | POA: Diagnosis not present

## 2020-05-14 NOTE — Patient Instructions (Signed)
Pelvic Organ Prolapse Pelvic organ prolapse is the stretching, bulging, or dropping of pelvic organs into an abnormal position. It happens when the muscles and tissues that surround and support pelvic structures become weak or stretched. Pelvic organ prolapse can involve the:  Vagina (vaginal prolapse).  Uterus (uterine prolapse).  Bladder (cystocele).  Rectum (rectocele).  Intestines (enterocele). When organs other than the vagina are involved, they often bulge into the vagina or protrude from the vagina, depending on how severe the prolapse is. What are the causes? This condition may be caused by:  Pregnancy, labor, and childbirth.  Past pelvic surgery.  Decreased production of the hormone estrogen associated with menopause.  Consistently lifting more than 50 lb (23 kg).  Obesity.  Long-term inability to pass stool (chronic constipation).  A cough that lasts a long time (chronic).  Buildup of fluid in the abdomen due to certain diseases and other conditions. What are the signs or symptoms? Symptoms of this condition include:  Passing a little urine (loss of bladder control) when you cough, sneeze, strain, and exercise (stress incontinence). This may be worse immediately after childbirth. It may gradually improve over time.  Feeling pressure in your pelvis or vagina. This pressure may increase when you cough or when you are passing stool.  A bulge that protrudes from the opening of your vagina.  Difficulty passing urine or stool.  Pain in your lower back.  Pain, discomfort, or disinterest in sex.  Repeated bladder infections (urinary tract infections).  Difficulty inserting a tampon. In some people, this condition causes no symptoms. How is this diagnosed? This condition may be diagnosed based on a vaginal and rectal exam. During the exam, you may be asked to cough and strain while you are lying down, sitting, and standing up. Your health care provider will  determine if other tests are required, such as bladder function tests. How is this treated? Treatment for this condition may depend on your symptoms. Treatment may include:  Lifestyle changes, such as changes to your diet.  Emptying your bladder at scheduled times (bladder training therapy). This can help reduce or avoid urinary incontinence.  Estrogen. Estrogen may help mild prolapse by increasing the strength and tone of pelvic floor muscles.  Kegel exercises. These may help mild cases of prolapse by strengthening and tightening the muscles of the pelvic floor.  A soft, flexible device that helps support the vaginal walls and keep pelvic organs in place (pessary). This is inserted into your vagina by your health care provider.  Surgery. This is often the only form of treatment for severe prolapse. Follow these instructions at home:  Avoid drinking beverages that contain caffeine or alcohol.  Increase your intake of high-fiber foods. This can help decrease constipation and straining during bowel movements.  Lose weight if recommended by your health care provider.  Wear a sanitary pad or adult diapers if you have urinary incontinence.  Avoid heavy lifting and straining with exercise and work. Do not hold your breath when you perform mild to moderate lifting and exercise activities. Limit your activities as directed by your health care provider.  Do Kegel exercises as directed by your health care provider. To do this: ? Squeeze your pelvic floor muscles tight. You should feel a tight lift in your rectal area and a tightness in your vaginal area. Keep your stomach, buttocks, and legs relaxed. ? Hold the muscles tight for up to 10 seconds. ? Relax your muscles. ? Repeat this exercise 50 times a day,   or as many times as told by your health care provider. Continue to do this exercise for at least 4-6 weeks, or for as long as told by your health care provider.  Take over-the-counter and  prescription medicines only as told by your health care provider.  If you have a pessary, take care of it as told by your health care provider.  Keep all follow-up visits as told by your health care provider. This is important. Contact a health care provider if you:  Have symptoms that interfere with your daily activities or sex life.  Need medicine to help with the discomfort.  Notice bleeding from your vagina that is not related to your period.  Have a fever.  Have pain or bleeding when you urinate.  Have bleeding when you pass stool.  Pass urine when you have sex.  Have chronic constipation.  Have a pessary that falls out.  Have bad smelling vaginal discharge.  Have an unusual, low pain in your abdomen. Summary  Pelvic organ prolapse is the stretching, bulging, or dropping of pelvic organs into an abnormal position. It happens when the muscles and tissues that surround and support pelvic structures become weak or stretched.  When organs other than the vagina are involved, they often bulge into the vagina or protrude from the vagina, depending on how severe the prolapse is.  In most cases, this condition needs to be treated only if it produces symptoms. Treatment may include lifestyle changes, estrogen, Kegel exercises, pessary insertion, or surgery.  Avoid heavy lifting and straining with exercise and work. Do not hold your breath when you perform mild to moderate lifting and exercise activities. Limit your activities as directed by your health care provider. This information is not intended to replace advice given to you by your health care provider. Make sure you discuss any questions you have with your health care provider. Document Revised: 09/07/2017 Document Reviewed: 09/07/2017 Elsevier Patient Education  2020 [employer]  

## 2020-05-14 NOTE — Progress Notes (Signed)
    Subjective:    Patient ID: Angelica Rivers is a 74 y.o. female presenting with vaginal cyst  on 05/14/2020  HPI: Thinks she is having some bleeding form a cyst. Has been menopausal for years. Has some difficulties with urination and has to sometimes push it up to help. Has no incontinence.  Has noticed some blood in her panties. Notes it every once in a while x several months.   Review of Systems  Constitutional: Negative for chills and fever.  Respiratory: Negative for shortness of breath.   Cardiovascular: Negative for chest pain.  Gastrointestinal: Negative for abdominal pain, nausea and vomiting.  Genitourinary: Negative for dysuria.  Skin: Negative for rash.      Objective:    BP 123/77   Pulse 73   Ht 5\' 5"  (1.651 m)   Wt 125 lb (56.7 kg)   BMI 20.80 kg/m  Physical Exam Constitutional:      General: She is not in acute distress.    Appearance: She is well-developed.  HENT:     Head: Normocephalic and atraumatic.  Eyes:     General: No scleral icterus. Cardiovascular:     Rate and Rhythm: Normal rate.  Pulmonary:     Effort: Pulmonary effort is normal.  Abdominal:     Palpations: Abdomen is soft.  Genitourinary:    Comments: Uterine prolapse stage 4 Musculoskeletal:     Cervical back: Neck supple.  Skin:    General: Skin is warm and dry.  Neurological:     Mental Status: She is alert and oriented to person, place, and time.         Assessment & Plan:   Problem List Items Addressed This Visit      Unprioritized   Uterine prolapse    Complete uterine descensus.  The cervix is 5 cm past the introitus.  There is some keratinization that is happened near which is likely causing a bit of irritation and possible bleeding.  I have discussed with her possible treatment options including pessary, colpocleisis, referral to uro-Gyn for surgical options.  After hearing all of these options, the patient asked if she could forego any treatment.  If this does not  bother her. then this is a perfectly satisfactory option.      Postmenopausal bleeding - Primary    Suspect bleeding is related to complete procidentia.  However, will check pelvic ultrasound.  Endometrial biopsy pending if the lining is greater than 5 mm.      Relevant Orders   US PELVIC COMPLETE WITH TRANSVAGINAL      Total time in review of prior notes, pathology, labs, history taking, review with patient, exam, note writing, discussion of options, plan for next steps, alternatives and risks of treatment: 48 minutes.  Return in about 4 weeks (around 06/11/2020) for a follow-up.  Donnamae Jude 05/15/2020 9:28 PM

## 2020-05-14 NOTE — Progress Notes (Signed)
Has a vaginal cyst that her PCP has been watching over the years and now recommends getting it checked out

## 2020-05-15 ENCOUNTER — Encounter: Payer: Self-pay | Admitting: Family Medicine

## 2020-05-15 DIAGNOSIS — N950 Postmenopausal bleeding: Secondary | ICD-10-CM | POA: Insufficient documentation

## 2020-05-15 NOTE — Assessment & Plan Note (Signed)
Suspect bleeding is related to complete procidentia.  However, will check pelvic ultrasound.  Endometrial biopsy pending if the lining is greater than 5 mm.

## 2020-05-15 NOTE — Assessment & Plan Note (Signed)
Complete uterine descensus.  The cervix is 5 cm past the introitus.  There is some keratinization that is happened near which is likely causing a bit of irritation and possible bleeding.  I have discussed with her possible treatment options including pessary, colpocleisis, referral to uro-Gyn for surgical options.  After hearing all of these options, the patient asked if she could forego any treatment.  If this does not bother her. then this is a perfectly satisfactory option.

## 2020-05-22 ENCOUNTER — Ambulatory Visit
Admission: RE | Admit: 2020-05-22 | Discharge: 2020-05-22 | Disposition: A | Discharge status: Home / Self Care | Payer: Medicare Other | Source: Ambulatory Visit | Attending: Family Medicine | Admitting: Family Medicine

## 2020-05-22 ENCOUNTER — Other Ambulatory Visit: Payer: Self-pay

## 2020-05-22 DIAGNOSIS — N950 Postmenopausal bleeding: Secondary | ICD-10-CM | POA: Insufficient documentation

## 2020-05-23 ENCOUNTER — Telehealth: Payer: Self-pay | Admitting: *Deleted

## 2020-05-23 MED ORDER — MISOPROSTOL 200 MCG PO TABS: 200.0000 ug | ORAL_TABLET | Freq: Once | ORAL | 0 refills | Status: DC

## 2020-05-23 NOTE — Telephone Encounter (Signed)
Called pt to let her know we need to do a endometrial biopsy due to her ultrasound results. And will send in medication for her to take the night before and morning of appt. Pt verbalizes and understands.

## 2020-05-23 NOTE — Telephone Encounter (Signed)
-----   Message from Donnamae Jude, MD sent at 05/22/2020  3:34 PM EDT ----- Needs endometrial biopsy scheduled with cytotec prior to office procedure 200 mcg po night before and morning of biopsy

## 2020-06-05 ENCOUNTER — Telehealth: Payer: Self-pay

## 2020-06-05 NOTE — Telephone Encounter (Signed)
Pt said [facility] told her they don't have the order for bone density. I let her know the order is in the system but was unsure if we need to send it to them?  Pt is scheduled for 07/12/20.  Phone # [phone]

## 2020-06-05 NOTE — Telephone Encounter (Signed)
Ashtyn -tell me if I need to do anything, thanks  Cc to [employer]

## 2020-06-06 NOTE — Telephone Encounter (Signed)
Since they are not in Epic order has to be printed and faxed to them.  Order printed and Dr. Glori Bickers signed it and I faxed it to [facility]  Pt notified

## 2020-06-26 ENCOUNTER — Encounter: Payer: Self-pay | Admitting: Family Medicine

## 2020-06-26 ENCOUNTER — Other Ambulatory Visit: Payer: Self-pay

## 2020-06-26 ENCOUNTER — Other Ambulatory Visit: Payer: Self-pay | Admitting: Family Medicine

## 2020-06-26 ENCOUNTER — Ambulatory Visit (INDEPENDENT_AMBULATORY_CARE_PROVIDER_SITE_OTHER): Payer: Medicare Other | Admitting: Family Medicine

## 2020-06-26 DIAGNOSIS — N950 Postmenopausal bleeding: Secondary | ICD-10-CM

## 2020-06-26 NOTE — Patient Instructions (Signed)

## 2020-06-26 NOTE — Progress Notes (Signed)
   Subjective:    Patient ID: Angelica Rivers is a 74 y.o. female presenting with Biopsy  on 06/26/2020  HPI: Here for endometrial sampling. Has complete procedentia. U/s reveals endometrial mass and she has had some spotting. Continue to have minimal bleeding. She took cytotec prior to today  Review of Systems  Constitutional: Negative for chills and fever.  Respiratory: Negative for shortness of breath.   Cardiovascular: Negative for chest pain.  Gastrointestinal: Negative for abdominal pain, nausea and vomiting.  Genitourinary: Negative for dysuria.  Skin: Negative for rash.      Objective:    BP 121/75   Pulse 69  Physical Exam Constitutional:      General: She is not in acute distress.    Appearance: She is well-developed.  HENT:     Head: Normocephalic and atraumatic.  Eyes:     General: No scleral icterus. Cardiovascular:     Rate and Rhythm: Normal rate.  Pulmonary:     Effort: Pulmonary effort is normal.  Abdominal:     Palpations: Abdomen is soft.  Genitourinary:    Comments: Complete procidentia with cervix and part of uterine corpus outside of the vagina. Musculoskeletal:     Cervical back: Neck supple.  Skin:    General: Skin is warm and dry.  Neurological:     Mental Status: She is alert and oriented to person, place, and time.    Procedure: Patient given informed consent, signed copy in the chart, time out was performed. Appropriate time out taken. . The patient was placed in the lithotomy position and the cervix brought into view with sterile speculum.  Portio of cervix cleansed x 2 with betadine swabs.  A tenaculum was placed in the anterior lip of the cervix.  The uterus was sounded for depth of 10 cm. A pipelle was introduced to into the uterus, suction created,  and an endometrial sample was obtained. All equipment was removed and accounted for.  The patient tolerated the procedure well.         Assessment & Plan:   Problem List Items  Addressed This Visit      Unprioritized   Postmenopausal bleeding    S/p biopsy - treatment based on results Patient given post procedure instructions.          Total time in review of prior notes, pathology, labs, history taking, review with patient, exam, note writing, discussion of options, plan for next steps, alternatives and risks of treatment: 10 minutes.  Return if symptoms worsen or fail to improve.  Donnamae Jude 06/26/2020 10:52 AM

## 2020-06-26 NOTE — Assessment & Plan Note (Addendum)
S/p biopsy - treatment based on results Patient given post procedure instructions.

## 2020-06-26 NOTE — Addendum Note (Signed)
Addended by: Donnamae Jude on: 06/26/2020 10:58 AM   Modules accepted: Orders

## 2020-07-03 ENCOUNTER — Telehealth: Payer: Self-pay

## 2020-07-03 NOTE — Telephone Encounter (Signed)
Informed pt of results. Pt denies any bleeding currently. Advised pt to call the office back if anything changes. Pt verbalized understanding.

## 2020-07-03 NOTE — Telephone Encounter (Signed)
-----   Message from Donnamae Jude, MD sent at 07/01/2020  8:02 AM EDT ----- Her biopsy is negative. If bleeding persists, may need to do a D and C

## 2020-07-12 ENCOUNTER — Encounter: Payer: Self-pay | Admitting: Family Medicine

## 2020-07-12 DIAGNOSIS — M85851 Other specified disorders of bone density and structure, right thigh: Secondary | ICD-10-CM | POA: Diagnosis not present

## 2020-07-12 DIAGNOSIS — M85832 Other specified disorders of bone density and structure, left forearm: Secondary | ICD-10-CM | POA: Diagnosis not present

## 2020-07-12 DIAGNOSIS — M85852 Other specified disorders of bone density and structure, left thigh: Secondary | ICD-10-CM | POA: Diagnosis not present

## 2020-07-12 DIAGNOSIS — Z1231 Encounter for screening mammogram for malignant neoplasm of breast: Secondary | ICD-10-CM | POA: Diagnosis not present

## 2020-07-12 LAB — HM DEXA SCAN

## 2020-08-06 ENCOUNTER — Encounter: Payer: Self-pay | Admitting: Family Medicine

## 2021-04-29 IMAGING — US US PELVIS COMPLETE WITH TRANSVAGINAL
1 series · 15 of 25 positions shown · non-contrast
Comparison: CT 01/24/2016

CLINICAL DATA: Postmenopausal bleeding

EXAM:
TRANSABDOMINAL AND TRANSVAGINAL ULTRASOUND OF PELVIS
TECHNIQUE: Both transabdominal and transvaginal ultrasound examinations of the
pelvis were performed. Transabdominal technique was performed for
global imaging of the pelvis including uterus, ovaries, adnexal
regions, and pelvic cul-de-sac. It was necessary to proceed with
endovaginal exam following the transabdominal exam to visualize the
endometrium and ovaries.

[Series 1: us pelvis complete with transvaginal · 93 acquisitions, 15 frames shown]
[im 1/93]
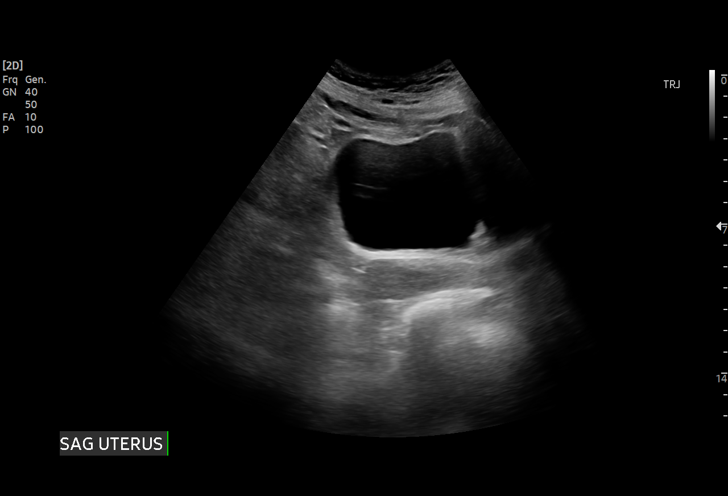
[im 8/93]
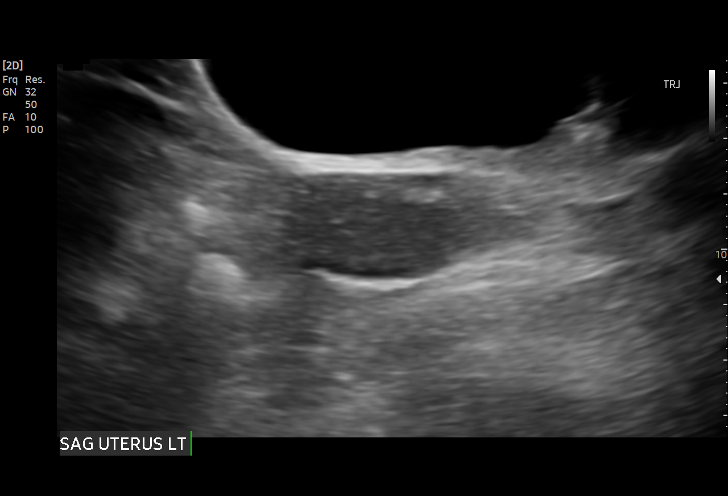
[im 16/93]
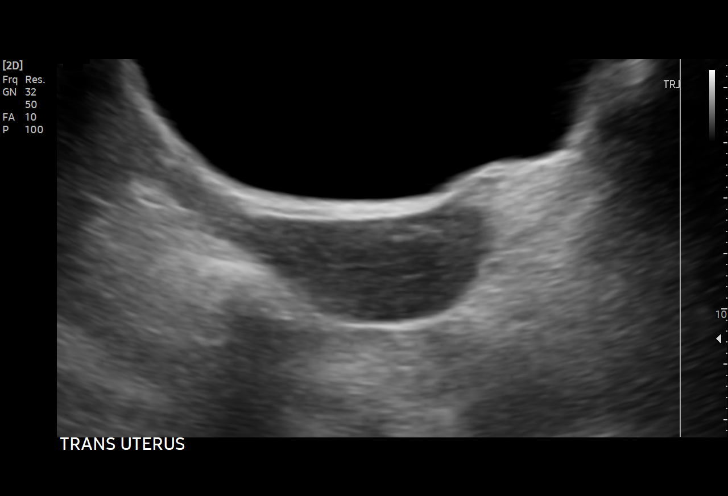
[im 20/93]
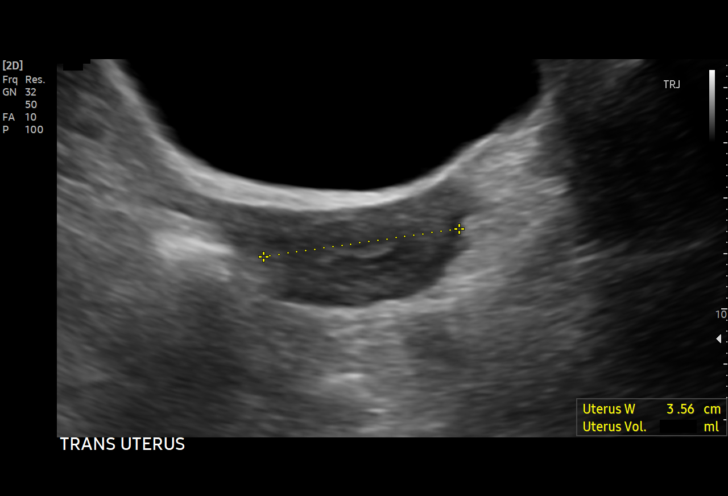
[im 27/93]
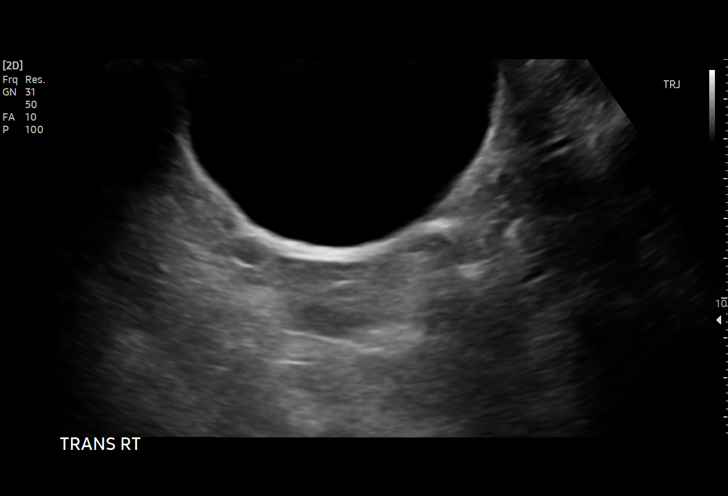
[im 35/93]
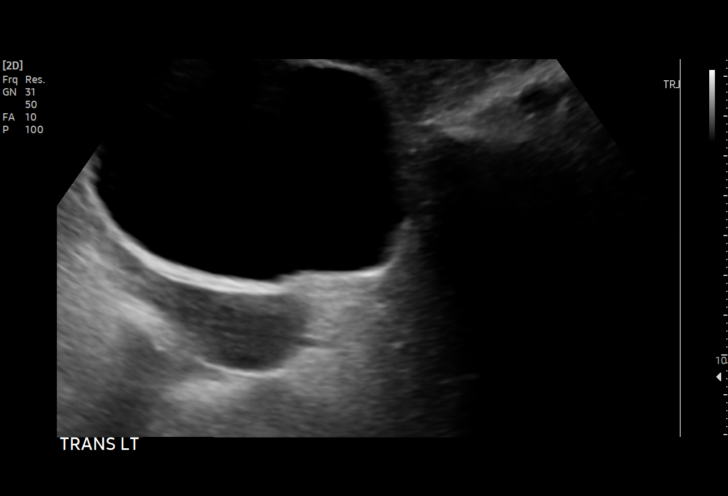
[im 39/93]
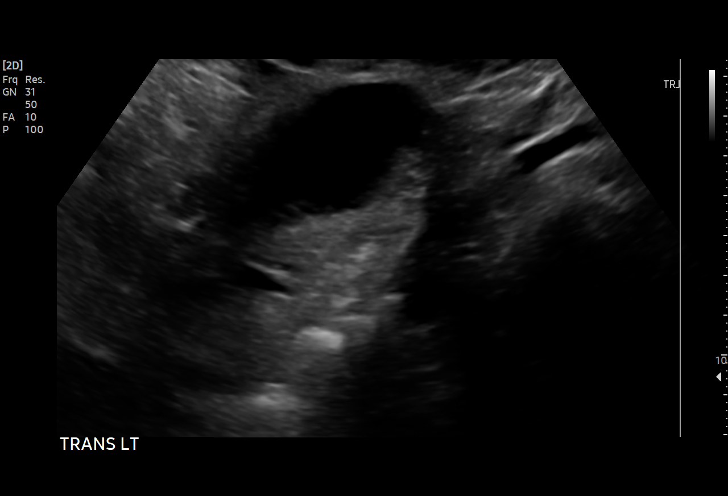
[im 47/93]
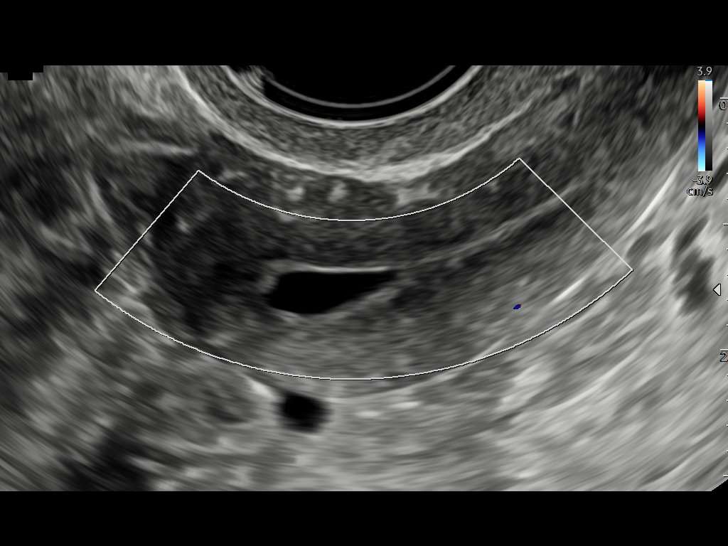
[im 54/93]
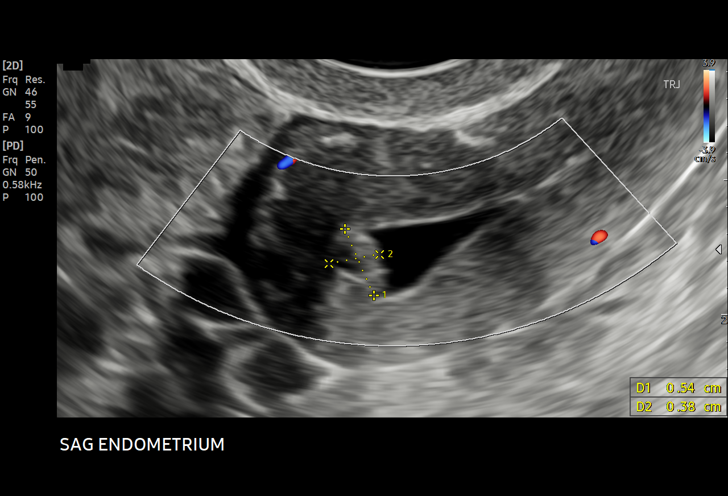
[im 58/93]
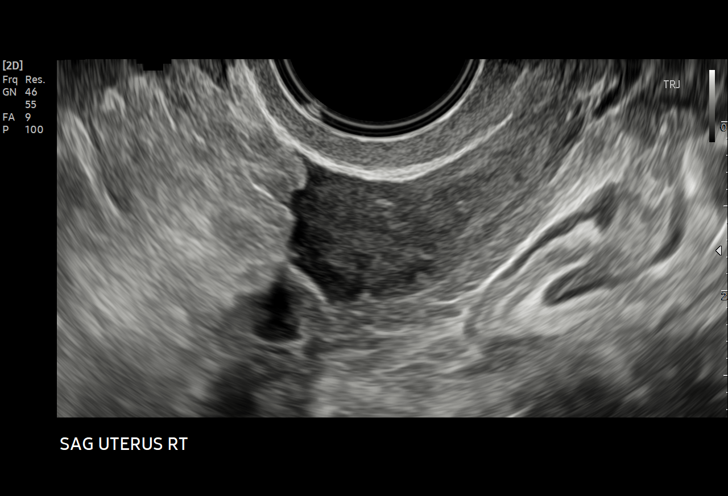
[im 66/93]
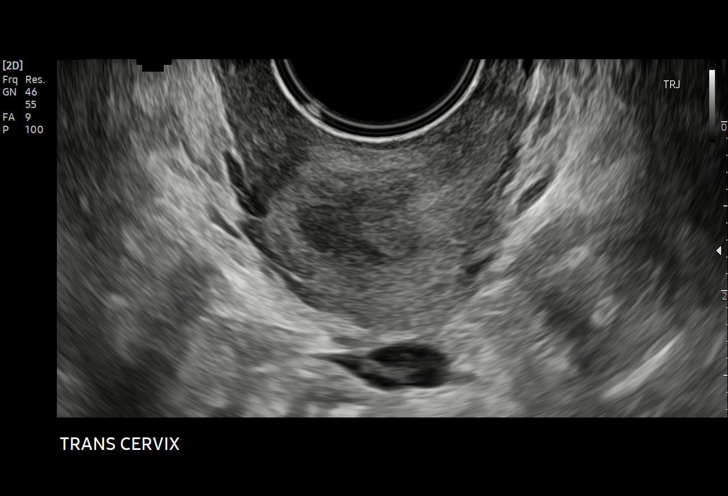
[im 73/93]
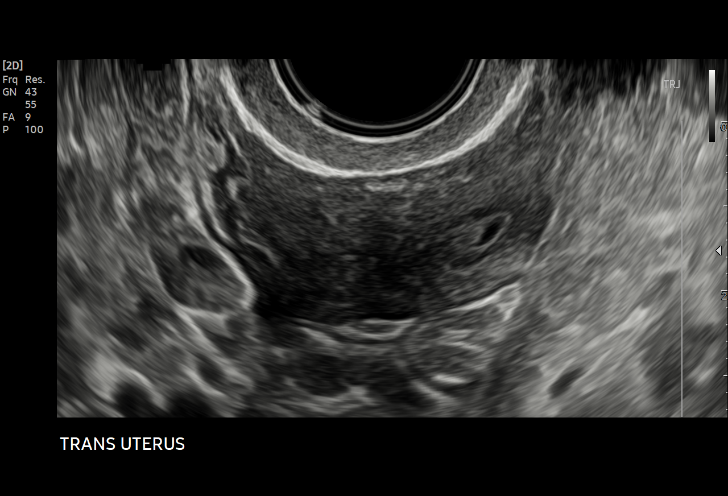
[im 77/93]
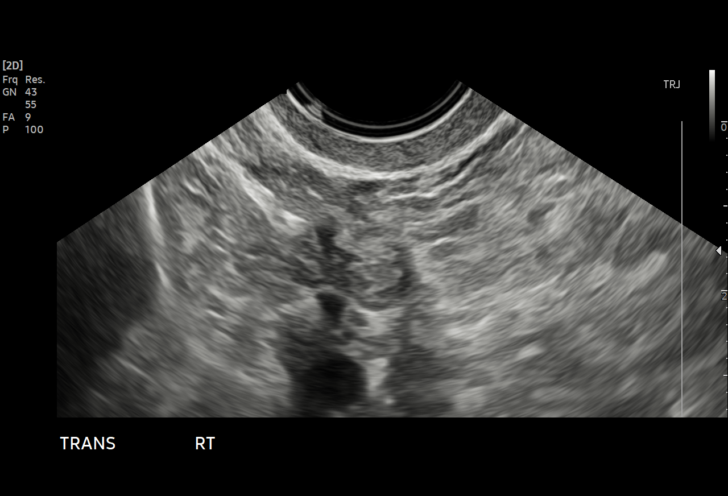
[im 85/93]
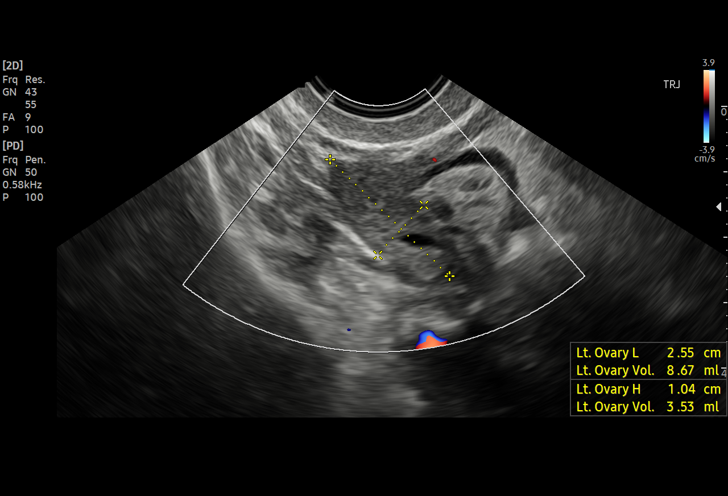
[im 93/93]
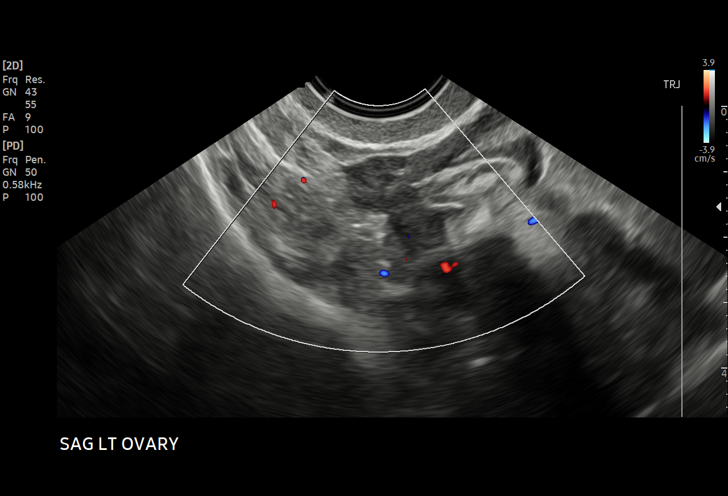

[15 of 25 positions shown; findings below may reference images not displayed]

FINDINGS: Uterus

Measurements: 6.1 x 2.0 x 3.6 cm = volume: 22 mL. No fibroids or
other mass visualized.

Endometrium

Thickness: 1 mm in thickness. There is fluid within the endometrial
canal. Two soft tissue masses protrude into the canal measuring up
to 7 mm each.

Right ovary

Measurements: Not visualized.  No adnexal mass seen.

Left ovary

Measurements: 2.6 x 1.0 x 1.1 cm = volume: 1.5 mL. Normal
appearance/no adnexal mass.

Other findings

No abnormal free fluid.
IMPRESSION: Abnormal focal soft tissue lesions protrude into the endometrial
canal which is fluid-filled. In the setting of post-menopausal
bleeding, endometrial sampling is indicated to exclude carcinoma. If
results are benign, sonohysterogram should be considered for focal
lesion work-up prior to hysteroscopy. (Ref: Radiological Reasoning:
Algorithmic Workup of Abnormal Vaginal Bleeding with Endovaginal
Sonography and Sonohysterography. AJR [id number]; [id number])

## 2021-05-06 ENCOUNTER — Telehealth: Payer: Self-pay

## 2021-05-06 DIAGNOSIS — H2513 Age-related nuclear cataract, bilateral: Secondary | ICD-10-CM | POA: Diagnosis not present

## 2021-05-06 NOTE — Telephone Encounter (Signed)
[place] Night - Client Nonclinical Telephone Record  [facility] Client [place] Night - Client Client Site [place] Physician Loura Pardon - MD Contact Type Call Who Is Calling Patient / Member / Family / Caregiver Caller Name [place] Phone Number [phone] Patient Name Angelica Rivers Patient DOB Feb 23, 1946 Call Type Message Only Information Provided Reason for Call Request for General Office Information Initial Comment Caller states that he spoke to Dr. Damita Dunnings yesterday about the doctor location changes. He is calling today about his wife who sees Dr. Glori Bickers. He needs to clarify where Dr. Glori Bickers is going to be moving to. Additional Comment Caller would like a call back to discuss where Dr. Glori Bickers was going to be relocating to. He is aware of where Dr. Damita Dunnings will be moving but needs to make sure his wife knows where to go for her appointments. Disp. Time Disposition Final User 05/06/2021 1:40:26 PM General Information Provided Yes Wynema Birch Call Closed By: Wynema Birch Transaction Date/Time: 05/06/2021 1:36:55 PM (ET)

## 2021-09-11 ENCOUNTER — Other Ambulatory Visit: Payer: Self-pay | Admitting: Family Medicine

## 2021-09-11 NOTE — Telephone Encounter (Signed)
Refill request Amoxicillin Last refill 04/30/21 #4/3 Last office visit 04/30/20 Upcoming appointment 10/28/21

## 2021-10-21 ENCOUNTER — Ambulatory Visit (INDEPENDENT_AMBULATORY_CARE_PROVIDER_SITE_OTHER): Payer: Medicare Other

## 2021-10-21 VITALS — Ht 65.0 in | Wt 125.0 lb

## 2021-10-21 DIAGNOSIS — Z1231 Encounter for screening mammogram for malignant neoplasm of breast: Secondary | ICD-10-CM

## 2021-10-21 DIAGNOSIS — Z0000 Encounter for general adult medical examination without abnormal findings: Secondary | ICD-10-CM | POA: Diagnosis not present

## 2021-10-21 DIAGNOSIS — Z780 Asymptomatic menopausal state: Secondary | ICD-10-CM | POA: Diagnosis not present

## 2021-10-21 NOTE — Progress Notes (Signed)
Subjective:   Angelica Rivers is a 76 y.o. female who presents for Medicare Annual (Subsequent) preventive examination.  I connected with Emilina Smarr today by telephone and verified that I am speaking with the correct person using two identifiers. Location patient: home Location provider: work Persons participating in the virtual visit: patient, Marine scientist.    I discussed the limitations, risks, security and privacy concerns of performing an evaluation and management service by telephone and the availability of in person appointments. I also discussed with the patient that there may be a patient responsible charge related to this service. The patient expressed understanding and verbally consented to this telephonic visit.    Interactive audio and video telecommunications were attempted between this provider and patient, however failed, due to patient having technical difficulties OR patient did not have access to video capability.  We continued and completed visit with audio only.  Some vital signs may be absent or patient reported.   Time Spent with patient on telephone encounter: 20 minutes  Review of Systems     Cardiac Risk Factors include: dyslipidemia;advanced age (>71men, >58 women)     Objective:    Today's Vitals   10/21/21 1028  Weight: 125 lb (56.7 kg)  Height: 5\' 5"  (1.651 m)   Body mass index is 20.8 kg/m.  Advanced Directives 10/21/2021 04/29/2020 10/02/2018 09/27/2017 11/10/2016 09/23/2016 01/24/2016  Does Patient Have a Medical Advance Directive? Yes No No No No No No  Type of Paramedic of [place];Living will - - - - - -  Does patient want to make changes to medical advance directive? Yes (MAU/Ambulatory/Procedural Areas - Information given) - - - - - -  Would patient like information on creating a medical advance directive? - No - Patient declined No - Patient declined No - Patient declined - - No - patient declined information    Current  Medications (verified) Outpatient Encounter Medications as of 10/21/2021  Medication Sig   aspirin 81 MG chewable tablet Chew 81 mg by mouth every other day.    Cholecalciferol (VITAMIN D) 2000 UNITS CAPS Take 2 capsules by mouth daily.   Flaxseed, Linseed, (FLAX SEED OIL) 1000 MG CAPS Take 1 capsule by mouth daily.    amoxicillin (AMOXIL) 500 MG capsule TAKE 4 CAPSULES BY MOUTH BEFORE DENTAL APPOINTMENT (Patient not taking: Reported on 10/21/2021)   misoprostol (CYTOTEC) 200 MCG tablet Take 1 tablet (200 mcg total) by mouth once for 1 dose. Pt to take 225mcg the night before her appointment, and then take 254mcg the morning of her appointment.   No facility-administered encounter medications on file as of 10/21/2021.    Allergies (verified) Neosporin [neomycin-bacitracin zn-polymyx] and Shingrix [zoster vac recomb adjuvanted]   History: Past Medical History:  Diagnosis Date   Anemia    History of repair of mitral valve    proph for dentist   Nephrolithiasis    Hx of   Osteopenia    Osteoporosis    fosamax for over 5 yrs.   Vaginal cyst    stable   Past Surgical History:  Procedure Laterality Date   CARDIAC SURGERY  1995   mitral valve repair carpenters ring   COLONOSCOPY     HEMORRHOID SURGERY     lithotripsy for kidney stones     MITRAL VALVE REPAIR  2004   ruptured ovarian cyst surgery     skin graft dog bite  2010   TONSILLECTOMY     Family History  Problem  Relation Age of Onset   Colon cancer Mother    Diabetes Brother    Esophageal cancer Neg Hx    Rectal cancer Neg Hx    Stomach cancer Neg Hx    Social History   Socioeconomic History   Marital status: Married    Spouse name: Not on file   Number of children: Not on file   Years of education: Not on file   Highest education level: Not on file  Occupational History   Not on file  Tobacco Use   Smoking status: Never   Smokeless tobacco: Never  Vaping Use   Vaping Use: Never used  Substance and Sexual  Activity   Alcohol use: No    Alcohol/week: 0.0 standard drinks   Drug use: No   Sexual activity: Never  Other Topics Concern   Not on file  Social History Narrative   Not on file   Social Determinants of Health   Financial Resource Strain: Low Risk    Difficulty of Paying Living Expenses: Not hard at all  Food Insecurity: No Food Insecurity   Worried About Charity fundraiser in the Last Year: Never true   [place] in the Last Year: Never true  Transportation Needs: No Transportation Needs   Lack of Transportation (Medical): No   Lack of Transportation (Non-Medical): No  Physical Activity: Inactive   Days of Exercise per Week: 0 days   Minutes of Exercise per Session: 0 min  Stress: No Stress Concern Present   Feeling of Stress : Not at all  Social Connections: Moderately Isolated   Frequency of Communication with Friends and Family: More than three times a week   Frequency of Social Gatherings with Friends and Family: More than three times a week   Attends Religious Services: Never   Marine scientist or Organizations: No   Attends Music therapist: Never   Marital Status: Married    Tobacco Counseling Counseling given: Not Answered   Clinical Intake:  Pre-visit preparation completed: Yes  Pain : No/denies pain     BMI - recorded: 20.8 Nutritional Status: BMI of 19-24  Normal Nutritional Risks: None Diabetes: No  How often do you need to have someone help you when you read instructions, pamphlets, or other written materials from your doctor or pharmacy?: 1 - Never  Diabetic? No  Interpreter Needed?: No  Information entered by :: Orrin Brigham LPN   Activities of Daily Living In your present state of health, do you have any difficulty performing the following activities: 10/21/2021  Hearing? N  Vision? N  Difficulty concentrating or making decisions? N  Walking or climbing stairs? N  Dressing or bathing? N  Doing errands,  shopping? Y  Comment husband [place] and eating ? N  Using the Toilet? N  In the past six months, have you accidently leaked urine? N  Do you have problems with loss of bowel control? N  Managing your Medications? N  Managing your Finances? N  Comment husband assist  Housekeeping or managing your Housekeeping? N  Some recent data might be hidden    Patient Care Team: Tower, Wynelle Fanny, MD as PCP - General  Indicate any recent Medical Services you may have received from other than [facility] providers in the past year (date may be approximate).     Assessment:   This is a routine wellness examination for Oluwademilade.  Hearing/Vision screen Hearing Screening - Comments:: No  issues  Vision Screening - Comments:: Last exam 07/2021, wears glasses, [employer]   Dietary issues and exercise activities discussed: Current Exercise Habits: The patient does not participate in regular exercise at present   Goals Addressed             This Visit's Progress    Patient Stated       Would like to maintain current routine       Depression Screen PHQ 2/9 Scores 10/21/2021 04/29/2020 10/02/2018 09/27/2017 09/23/2016 09/20/2016 09/15/2015  PHQ - 2 Score 0 0 0 0 0 0 0  PHQ- 9 Score - 0 0 0 - - -    Fall Risk Fall Risk  10/21/2021 04/29/2020 10/02/2018 09/27/2017 09/23/2016  Falls in the past year? 0 0 0 Yes No  Comment - - - pt fell over cord in yard; caused sprain to left thumb -  Number falls in past yr: 0 0 - 1 -  Injury with Fall? 0 0 - Yes -  Risk for fall due to : No Fall Risks No Fall Risks - - -  Follow up Falls prevention discussed Falls evaluation completed;Falls prevention discussed - - -    FALL RISK PREVENTION PERTAINING TO THE HOME:  Any stairs in or around the home? Yes  If so, are there any without handrails? No  Home free of loose throw rugs in walkways, pet beds, electrical cords, etc? Yes  Adequate lighting in your home to reduce risk of falls? Yes   ASSISTIVE  DEVICES UTILIZED TO PREVENT FALLS:  Life alert? No  Use of a cane, walker or w/c? No  Grab bars in the bathroom? Yes  Shower chair or bench in shower? No  Elevated toilet seat or a handicapped toilet? Yes   TIMED UP AND GO:  Was the test performed? No .   Cognitive Function: Normal cognitive status assessed by  this Nurse Health Advisor. No abnormalities found.   MMSE - Mini Mental State Exam 04/29/2020 10/02/2018 09/27/2017 09/23/2016  Orientation to time 5 5 5 5   Orientation to Place 5 5 5 5   Registration 3 3 3 3   Attention/ Calculation 5 0 0 0  Recall 3 3 3 3   Language- name 2 objects - 0 0 0  Language- repeat 1 1 1 1   Language- follow 3 step command - 3 3 3   Language- read & follow direction - 0 0 0  Write a sentence - 0 0 0  Copy design - 0 0 0  Total score - 20 20 20         Immunizations Immunization History  Administered Date(s) Administered   19-influenza Whole 06/14/2015   Fluad Quad(high Dose 65+) 05/14/2019   Influenza Whole 09/08/2006   Influenza, High Dose Seasonal PF 06/21/2014, 06/23/2016, 06/14/2017, 06/11/2018   Influenza-Unspecified 05/30/2013   Moderna Sars-Covid-2 Vaccination 10/30/2019   Pneumococcal Conjugate-13 12/17/2013   Pneumococcal Polysaccharide-23 10/13/2011   Td 04/20/2002, 02/27/2009   Tdap 12/25/2012, 03/06/2014   Zoster Recombinat (Shingrix) 01/05/2017   Zoster, Live 01/24/2013    TDAP status: Up to date  Flu Vaccine status: Up to date  Pneumococcal vaccine status: Up to date  Covid-19 vaccine status: Information provided on how to obtain vaccines.   Qualifies for Shingles Vaccine? Yes   Zostavax completed Yes   Shingrix Completed?: No.    Education has been provided regarding the importance of this vaccine. Patient has been advised to call insurance company to determine out of pocket expense if they  have not yet received this vaccine. Advised may also receive vaccine at local pharmacy or Health Dept. Verbalized acceptance and  understanding.  Screening Tests Health Maintenance  Topic Date Due   Zoster Vaccines- Shingrix (2 of 2) 03/02/2017   COVID-19 Vaccine (2 - Moderna series) 11/27/2019   INFLUENZA VACCINE  03/30/2021   MAMMOGRAM  07/12/2021   COLONOSCOPY (Pts 45-66yrs Insurance coverage will need to be confirmed)  11/10/2021   DEXA SCAN  07/13/2023   TETANUS/TDAP  03/06/2024   Pneumonia Vaccine 67+ Years old  Completed   Hepatitis C Screening  Completed   HPV VACCINES  Aged Out   PAP SMEAR-Modifier  Discontinued    Health Maintenance  Health Maintenance Due  Topic Date Due   Zoster Vaccines- Shingrix (2 of 2) 03/02/2017   COVID-19 Vaccine (2 - Moderna series) 11/27/2019   INFLUENZA VACCINE  03/30/2021   MAMMOGRAM  07/12/2021    Colorectal cancer screening: Type of screening: Colonoscopy. Completed 11/10/16. Repeat every 5 years  Mammogram status: Ordered 10/21/21. Pt provided with contact info and advised to call to schedule appt.   Bone Density status: Completed 07/12/20. Results reflect: Bone density results: OSTEOPENIA. Repeat every 2 years.  Lung Cancer Screening: (Low Dose CT Chest recommended if Age 77-80 years, 30 pack-year currently smoking OR have quit w/in 15years.) does not qualify.    Additional Screening:  Hepatitis C Screening: does qualify; Completed 09/23/16  Vision Screening: Recommended annual ophthalmology exams for early detection of glaucoma and other disorders of the eye. Is the patient up to date with their annual eye exam?  Yes  Who is the provider or what is the name of the office in which the patient attends annual eye exams? Provider information unavailable    Dental Screening: Recommended annual dental exams for proper oral hygiene  Community Resource Referral / Chronic Care Management: CRR required this visit?  No   CCM required this visit?  No      Plan:     I have personally reviewed and noted the following in the patients chart:   Medical and  social history Use of alcohol, tobacco or illicit drugs  Current medications and supplements including opioid prescriptions.  Functional ability and status Nutritional status Physical activity Advanced directives List of other physicians Hospitalizations, surgeries, and ER visits in previous 12 months Vitals Screenings to include cognitive, depression, and falls Referrals and appointments  In addition, I have reviewed and discussed with patient certain preventive protocols, quality metrics, and best practice recommendations. A written personalized care plan for preventive services as well as general preventive health recommendations were provided to patient.   Due to this being a telephonic visit, the after visit summary with patients personalized plan was offered to patient via mail or my-chart.  Patient preferred to pick up at office at next visit.    Loma Messing, LPN   [id number]   Nurse Health Advisor  Nurse Notes: none

## 2021-10-21 NOTE — Patient Instructions (Signed)
[name] , Thank you for taking time to complete your Medicare Wellness Visit. I appreciate your ongoing commitment to your health goals. Please review the following plan we discussed and let me know if I can assist you in the future.   Screening recommendations/referrals: Colonoscopy: due, last completed 11/10/16, you plan on discussing with PCP at your next appointment  Mammogram: due, last completed 07/12/20, ordered today, someone will call to schedule an appointment   Bone Density: due , last completed 07/12/20, ordered today someone will call to schedule an appointment Recommended yearly ophthalmology/optometry visit for glaucoma screening and checkup Recommended yearly dental visit for hygiene and checkup  Vaccinations: Influenza vaccine: up to date, please provide updated vaccine information for your chart. Pneumococcal vaccine: up to date Tdap vaccine: up to date Shingles vaccine: 1st dose received    Covid-19: newest booster available at your local pharmacy   Advanced directives: Please bring a copy of Living Will and/or [place] for your chart.   Conditions/risks identified: see problem list   Next appointment: Follow up in one year for your annual wellness visit 10/22/22 @ 10:30am, this will be a telephone visit    Preventive Care 65 Years and Older, Female Preventive care refers to lifestyle choices and visits with your health care provider that can promote health and wellness. What does preventive care include? A yearly physical exam. This is also called an annual well check. Dental exams once or twice a year. Routine eye exams. Ask your health care provider how often you should have your eyes checked. Personal lifestyle choices, including: Daily care of your teeth and gums. Regular physical activity. Eating a healthy diet. Avoiding tobacco and drug use. Limiting alcohol use. Practicing safe sex. Taking low-dose aspirin every day. Taking  vitamin and mineral supplements as recommended by your health care provider. What happens during an annual well check? The services and screenings done by your health care provider during your annual well check will depend on your age, overall health, lifestyle risk factors, and family history of disease. Counseling  Your health care provider may ask you questions about your: Alcohol use. Tobacco use. Drug use. Emotional well-being. Home and relationship well-being. Sexual activity. Eating habits. History of falls. Memory and ability to understand (cognition). Work and work Statistician. Reproductive health. Screening  You may have the following tests or measurements: Height, weight, and BMI. Blood pressure. Lipid and cholesterol levels. These may be checked every 5 years, or more frequently if you are over 49 years old. Skin check. Lung cancer screening. You may have this screening every year starting at age 76 if you have a 30-pack-year history of smoking and currently smoke or have quit within the past 15 years. Fecal occult blood test (FOBT) of the stool. You may have this test every year starting at age 76. Flexible sigmoidoscopy or colonoscopy. You may have a sigmoidoscopy every 5 years or a colonoscopy every 10 years starting at age 76. Hepatitis C blood test. Hepatitis B blood test. Sexually transmitted disease (STD) testing. Diabetes screening. This is done by checking your blood sugar (glucose) after you have not eaten for a while (fasting). You may have this done every 1-3 years. Bone density scan. This is done to screen for osteoporosis. You may have this done starting at age 76. Mammogram. This may be done every 1-2 years. Talk to your health care provider about how often you should have regular mammograms. Talk with your health care provider about your  test results, treatment options, and if necessary, the need for more tests. Vaccines  Your health care provider may  recommend certain vaccines, such as: Influenza vaccine. This is recommended every year. Tetanus, diphtheria, and acellular pertussis (Tdap, Td) vaccine. You may need a Td booster every 10 years. Zoster vaccine. You may need this after age 50. Pneumococcal 13-valent conjugate (PCV13) vaccine. One dose is recommended after age 15. Pneumococcal polysaccharide (PPSV23) vaccine. One dose is recommended after age 76. Talk to your health care provider about which screenings and vaccines you need and how often you need them. This information is not intended to replace advice given to you by your health care provider. Make sure you discuss any questions you have with your health care provider. Document Released: 09/12/2015 Document Revised: 05/05/2016 Document Reviewed: 06/17/2015 Elsevier Interactive Patient Education  2017 [place] Prevention in the Home Falls can cause injuries. They can happen to people of all ages. There are many things you can do to make your home safe and to help prevent falls. What can I do on the outside of my home? Regularly fix the edges of walkways and driveways and fix any cracks. Remove anything that might make you trip as you walk through a door, such as a raised step or threshold. Trim any bushes or trees on the path to your home. Use bright outdoor lighting. Clear any walking paths of anything that might make someone trip, such as rocks or tools. Regularly check to see if handrails are loose or broken. Make sure that both sides of any steps have handrails. Any raised decks and porches should have guardrails on the edges. Have any leaves, snow, or ice cleared regularly. Use sand or salt on walking paths during winter. Clean up any spills in your garage right away. This includes oil or grease spills. What can I do in the bathroom? Use night lights. Install grab bars by the toilet and in the tub and shower. Do not use towel bars as grab bars. Use non-skid  mats or decals in the tub or shower. If you need to sit down in the shower, use a plastic, non-slip stool. Keep the floor dry. Clean up any water that spills on the floor as soon as it happens. Remove soap buildup in the tub or shower regularly. Attach bath mats securely with double-sided non-slip rug tape. Do not have throw rugs and other things on the floor that can make you trip. What can I do in the bedroom? Use night lights. Make sure that you have a light by your bed that is easy to reach. Do not use any sheets or blankets that are too big for your bed. They should not hang down onto the floor. Have a firm chair that has side arms. You can use this for support while you get dressed. Do not have throw rugs and other things on the floor that can make you trip. What can I do in the kitchen? Clean up any spills right away. Avoid walking on wet floors. Keep items that you use a lot in easy-to-reach places. If you need to reach something above you, use a strong step stool that has a grab bar. Keep electrical cords out of the way. Do not use floor polish or wax that makes floors slippery. If you must use wax, use non-skid floor wax. Do not have throw rugs and other things on the floor that can make you trip. What can I do with my  stairs? Do not leave any items on the stairs. Make sure that there are handrails on both sides of the stairs and use them. Fix handrails that are broken or loose. Make sure that handrails are as long as the stairways. Check any carpeting to make sure that it is firmly attached to the stairs. Fix any carpet that is loose or worn. Avoid having throw rugs at the top or bottom of the stairs. If you do have throw rugs, attach them to the floor with carpet tape. Make sure that you have a light switch at the top of the stairs and the bottom of the stairs. If you do not have them, ask someone to add them for you. What else can I do to help prevent falls? Wear shoes  that: Do not have high heels. Have rubber bottoms. Are comfortable and fit you well. Are closed at the toe. Do not wear sandals. If you use a stepladder: Make sure that it is fully opened. Do not climb a closed stepladder. Make sure that both sides of the stepladder are locked into place. Ask someone to hold it for you, if possible. Clearly mark and make sure that you can see: Any grab bars or handrails. First and last steps. Where the edge of each step is. Use tools that help you move around (mobility aids) if they are needed. These include: Canes. Walkers. Scooters. Crutches. Turn on the lights when you go into a dark area. Replace any light bulbs as soon as they burn out. Set up your furniture so you have a clear path. Avoid moving your furniture around. If any of your floors are uneven, fix them. If there are any pets around you, be aware of where they are. Review your medicines with your doctor. Some medicines can make you feel dizzy. This can increase your chance of falling. Ask your doctor what other things that you can do to help prevent falls. This information is not intended to replace advice given to you by your health care provider. Make sure you discuss any questions you have with your health care provider. Document Released: 06/12/2009 Document Revised: 01/22/2016 Document Reviewed: 09/20/2014 Elsevier Interactive Patient Education  2017 [employer].

## 2021-10-28 ENCOUNTER — Encounter: Payer: Self-pay | Admitting: Family Medicine

## 2021-10-28 ENCOUNTER — Other Ambulatory Visit: Payer: Self-pay

## 2021-10-28 ENCOUNTER — Ambulatory Visit (INDEPENDENT_AMBULATORY_CARE_PROVIDER_SITE_OTHER): Payer: Medicare Other | Admitting: Family Medicine

## 2021-10-28 VITALS — BP 130/74 | HR 76 | Temp 97.5°F | Ht 65.0 in | Wt 126.0 lb

## 2021-10-28 DIAGNOSIS — M8589 Other specified disorders of bone density and structure, multiple sites: Secondary | ICD-10-CM

## 2021-10-28 DIAGNOSIS — Z1211 Encounter for screening for malignant neoplasm of colon: Secondary | ICD-10-CM | POA: Diagnosis not present

## 2021-10-28 DIAGNOSIS — D696 Thrombocytopenia, unspecified: Secondary | ICD-10-CM

## 2021-10-28 DIAGNOSIS — E7800 Pure hypercholesterolemia, unspecified: Secondary | ICD-10-CM

## 2021-10-28 DIAGNOSIS — Z1231 Encounter for screening mammogram for malignant neoplasm of breast: Secondary | ICD-10-CM | POA: Diagnosis not present

## 2021-10-28 DIAGNOSIS — E559 Vitamin D deficiency, unspecified: Secondary | ICD-10-CM

## 2021-10-28 DIAGNOSIS — Z0000 Encounter for general adult medical examination without abnormal findings: Secondary | ICD-10-CM

## 2021-10-28 LAB — LIPID PANEL
Cholesterol: 201 mg/dL — ABNORMAL HIGH (ref 0–200)
HDL: 69.7 mg/dL (ref >39.00)
LDL Cholesterol: 118 mg/dL — ABNORMAL HIGH (ref 0–99)
NonHDL: 131.34
Total CHOL/HDL Ratio: 3
Triglycerides: 65 mg/dL (ref 0.0–149.0)
VLDL: 13 mg/dL (ref 0.0–40.0)

## 2021-10-28 LAB — COMPREHENSIVE METABOLIC PANEL
ALT: 20 U/L (ref 0–35)
AST: 23 U/L (ref 0–37)
Albumin: 4.4 g/dL (ref 3.5–5.2)
Alkaline Phosphatase: 53 U/L (ref 39–117)
BUN: 18 mg/dL (ref 6–23)
CO2: 29 mEq/L (ref 19–32)
Calcium: 9.3 mg/dL (ref 8.4–10.5)
Chloride: 104 mEq/L (ref 96–112)
Creatinine, Ser: 1.02 mg/dL (ref 0.40–1.20)
GFR: 53.81 mL/min — ABNORMAL LOW (ref >60.00)
Glucose, Bld: 87 mg/dL (ref 70–99)
Potassium: 4.8 mEq/L (ref 3.5–5.1)
Sodium: 138 mEq/L (ref 135–145)
Total Bilirubin: 0.5 mg/dL (ref 0.2–1.2)
Total Protein: 7.1 g/dL (ref 6.0–8.3)

## 2021-10-28 LAB — CBC WITH DIFFERENTIAL/PLATELET
Basophils Absolute: 0 10*3/uL (ref 0.0–0.1)
Basophils Relative: 0.4 % (ref 0.0–3.0)
Eosinophils Absolute: 0.1 10*3/uL (ref 0.0–0.7)
Eosinophils Relative: 1.4 % (ref 0.0–5.0)
HCT: 40.6 % (ref 36.0–46.0)
Hemoglobin: 13.1 g/dL (ref 12.0–15.0)
Lymphocytes Relative: 34.5 % (ref 12.0–46.0)
Lymphs Abs: 1.7 10*3/uL (ref 0.7–4.0)
MCHC: 32.2 g/dL (ref 30.0–36.0)
MCV: 87.9 fl (ref 78.0–100.0)
Monocytes Absolute: 0.4 10*3/uL (ref 0.1–1.0)
Monocytes Relative: 8.5 % (ref 3.0–12.0)
Neutro Abs: 2.8 10*3/uL (ref 1.4–7.7)
Neutrophils Relative %: 55.2 % (ref 43.0–77.0)
Platelets: 130 10*3/uL — ABNORMAL LOW (ref 150.0–400.0)
RBC: 4.61 Mil/uL (ref 3.87–5.11)
RDW: 13.8 % (ref 11.5–15.5)
WBC: 5 10*3/uL (ref 4.0–10.5)

## 2021-10-28 LAB — VITAMIN D 25 HYDROXY (VIT D DEFICIENCY, FRACTURES): VITD: 90.2 ng/mL (ref 30.00–100.00)

## 2021-10-28 LAB — TSH: TSH: 1.04 u[IU]/mL (ref 0.35–5.50)

## 2021-10-28 NOTE — Assessment & Plan Note (Signed)
Reviewed health habits including diet and exercise and skin cancer prevention ?Reviewed appropriate screening tests for age  ?Also reviewed health mt list, fam hx and immunization status , as well as social and family history   ?Reviewed amw  ?See HPI ?Labs ordered ?Declines 2nd shingrix  ?Declines covid booster  ?Mammogram ordered-pt will schedule at [facility] ?Colonoscopy due for 5 y recall (fam hx)- referral is in and pt will call Dr Eugenia Pancoast office to schedule  ?dexa is up to date until nov  ?No falls or fx  ?

## 2021-10-28 NOTE — Assessment & Plan Note (Signed)
Cbc today  ?More bruising on arms from thin skin ?

## 2021-10-28 NOTE — Assessment & Plan Note (Signed)
Level added to labs  ?

## 2021-10-28 NOTE — Assessment & Plan Note (Signed)
Rev dexa 06/2020 ?No falls or fractures  ?Taking 4000 iu D3 daily  ?D level pending  ? ?Encourage weight bearing exercise  ?

## 2021-10-28 NOTE — Progress Notes (Signed)
Subjective:    Patient ID: Angelica Rivers, female    DOB: [birth year], 76 y.o.   MRN: [id number]  This visit occurred during the SARS-CoV-2 public health emergency.  Safety protocols were in place, including screening questions prior to the visit, additional usage of staff PPE, and extensive cleaning of exam room while observing appropriate contact time as indicated for disinfecting solutions.   HPI Here for health maintenance exam and to review chronic medical problems    Wt Readings from Last 3 Encounters:  10/28/21 126 lb (57.2 kg)  10/21/21 125 lb (56.7 kg)  05/14/20 125 lb (56.7 kg)   20.97 kg/m  Taking care of grand kids Keeping busy overall   Been feeling good  Taking care of herself    Had amw on 2/22- reviewed   Zoster status-had one shingrix with severe local reaction/rash  Covid imm 2021- hesistant to get another one Angelica Rivers had local reaction   Tdap 02/2014 Pna vaccine utd  Flu shot 06/2021   Mammogram 06/2020   Self breast exam -no lumps or changes    Colonoscopy 10/2016   Mother had colon cancer Her husband had a colonoscopy and "they changed everything"   Due this month-will get it scheduled    Dexa 06/2020  Osteopenia with mixed trend [place]  none  Fractures none   Takes 4000 iu D3 daily  Exercise - regular activities  Heavy housework and stairs   BP Readings from Last 3 Encounters:  10/28/21 130/74  06/26/20 121/75  05/14/20 123/77   Pulse Readings from Last 3 Encounters:  10/28/21 76  06/26/20 69  05/14/20 73      Hyperlipidemia  Lab Results  Component Value Date   CHOL 192 04/25/2020   HDL 68.20 04/25/2020   LDLCALC 108 (H) 04/25/2020   LDLDIRECT 117.7 10/06/2011   TRIG 75.0 04/25/2020   CHOLHDL 3 04/25/2020   Fasted for labs   Diet is healthy -avoids a lot of sugar and fat    Patient Active Problem List   Diagnosis Date Noted   Encounter for screening mammogram for breast cancer 10/28/2021   Postmenopausal  bleeding 05/15/2020   Uterine prolapse 04/30/2020   Injection site reaction 11/11/2019   Estrogen deficiency 09/15/2015   Colon cancer screening 12/17/2013   Encounter for Medicare annual wellness exam 12/08/2013   Routine general medical examination at a health care facility 12/16/2012   Gynecological examination 10/13/2011   Vitamin D deficiency 09/23/2009   Thrombocytopenia ([place]) 09/23/2009   Hyperlipidemia 09/12/2008   FIBROCYSTIC BREAST DISEASE 09/08/2007   Osteopenia 09/08/2007   NEPHROLITHIASIS, HX OF 09/08/2007   Past Medical History:  Diagnosis Date   Anemia    History of repair of mitral valve    proph for dentist   Nephrolithiasis    Hx of   Osteopenia    Osteoporosis    fosamax for over 5 yrs.   Vaginal cyst    stable   Past Surgical History:  Procedure Laterality Date   CARDIAC SURGERY  1995   mitral valve repair carpenters ring   COLONOSCOPY     HEMORRHOID SURGERY     lithotripsy for kidney stones     MITRAL VALVE REPAIR  2004   ruptured ovarian cyst surgery     skin graft dog bite  2010   TONSILLECTOMY     Social History   Tobacco Use   Smoking status: Never   Smokeless tobacco: Never  Vaping Use   Vaping Use: Never  used  Substance Use Topics   Alcohol use: No    Alcohol/week: 0.0 standard drinks   Drug use: No   Family History  Problem Relation Age of Onset   Colon cancer Mother    Diabetes Brother    Esophageal cancer Neg Hx    Rectal cancer Neg Hx    Stomach cancer Neg Hx    Allergies  Allergen Reactions   Neosporin [Neomycin-Bacitracin Zn-Polymyx] Other (See Comments)    Festers up   Shingrix [Zoster Vac Recomb Adjuvanted]     Rash    Current Outpatient Medications on File Prior to Visit  Medication Sig Dispense Refill   amoxicillin (AMOXIL) 500 MG capsule TAKE 4 CAPSULES BY MOUTH BEFORE DENTAL APPOINTMENT 4 capsule 3   aspirin 81 MG chewable tablet Chew 81 mg by mouth every other day.      Cholecalciferol (VITAMIN D) 2000  UNITS CAPS Take 2 capsules by mouth daily.     Flaxseed, Linseed, (FLAX SEED OIL) 1000 MG CAPS Take 1 capsule by mouth daily.      misoprostol (CYTOTEC) 200 MCG tablet Take 1 tablet (200 mcg total) by mouth once for 1 dose. Pt to take 236mcg the night before her appointment, and then take 267mcg the morning of her appointment. 2 tablet 0   No current facility-administered medications on file prior to visit.    Review of Systems  Constitutional:  Negative for activity change, appetite change, fatigue, fever and unexpected weight change.  HENT:  Negative for congestion, ear pain, rhinorrhea, sinus pressure and sore throat.   Eyes:  Negative for pain, redness and visual disturbance.  Respiratory:  Negative for cough, shortness of breath and wheezing.   Cardiovascular:  Negative for chest pain and palpitations.  Gastrointestinal:  Negative for abdominal pain, blood in stool, constipation and diarrhea.  Endocrine: Negative for polydipsia and polyuria.  Genitourinary:  Negative for dysuria, frequency and urgency.  Musculoskeletal:  Negative for arthralgias, back pain and myalgias.  Skin:  Negative for pallor and rash.  Allergic/Immunologic: Negative for environmental allergies.  Neurological:  Negative for dizziness, syncope and headaches.  Hematological:  Negative for adenopathy. Does not bruise/bleed easily.  Psychiatric/Behavioral:  Negative for decreased concentration and dysphoric mood. The patient is not nervous/anxious.       Objective:   Physical Exam Constitutional:      General: She is not in acute distress.    Appearance: Normal appearance. She is well-developed and normal weight. She is not ill-appearing or diaphoretic.  HENT:     Head: Normocephalic and atraumatic.     Right Ear: Tympanic membrane, ear canal and external ear normal.     Left Ear: Tympanic membrane, ear canal and external ear normal.     Nose: Nose normal. No congestion.     Mouth/Throat:     Mouth: Mucous  membranes are moist.     Pharynx: Oropharynx is clear. No posterior oropharyngeal erythema.  Eyes:     General: No scleral icterus.    Extraocular Movements: Extraocular movements intact.     Conjunctiva/sclera: Conjunctivae normal.     Pupils: Pupils are equal, round, and reactive to light.  Neck:     Thyroid: No thyromegaly.     Vascular: No carotid bruit or JVD.  Cardiovascular:     Rate and Rhythm: Normal rate and regular rhythm.     Pulses: Normal pulses.     Heart sounds: Normal heart sounds.    No gallop.  Pulmonary:  Effort: Pulmonary effort is normal. No respiratory distress.     Breath sounds: Normal breath sounds. No wheezing.     Comments: Good air exch Chest:     Chest wall: No tenderness.  Abdominal:     General: Bowel sounds are normal. There is no distension or abdominal bruit.     Palpations: Abdomen is soft. There is no mass.     Tenderness: There is no abdominal tenderness.     Hernia: No hernia is present.  Genitourinary:    Comments: Breast exam: No mass, nodules, thickening, tenderness, bulging, retraction, inflamation, nipple discharge or skin changes noted.  No axillary or clavicular LA.     Musculoskeletal:        General: No tenderness. Normal range of motion.     Cervical back: Normal range of motion and neck supple. No rigidity. No muscular tenderness.     Right lower leg: No edema.     Left lower leg: No edema.     Comments: No kyphosis   Lymphadenopathy:     Cervical: No cervical adenopathy.  Skin:    General: Skin is warm and dry.     Coloration: Skin is not pale.     Findings: No erythema or rash.     Comments: Solar lentigines diffusely Some sks  Neurological:     Mental Status: She is alert. Mental status is at baseline.     Cranial Nerves: No cranial nerve deficit.     Motor: No abnormal muscle tone.     Coordination: Coordination normal.     Gait: Gait normal.     Deep Tendon Reflexes: Reflexes are normal and symmetric. Reflexes  normal.  Psychiatric:        Mood and Affect: Mood normal.        Cognition and Memory: Cognition and memory normal.          Assessment & Plan:   Problem List Items Addressed This Visit       Musculoskeletal and Integument   Osteopenia    Rev dexa 06/2020 No falls or fractures  Taking 4000 iu D3 daily  D level pending   Encourage weight bearing exercise         Hematopoietic and Hemostatic   Thrombocytopenia ([place])    Cbc today  More bruising on arms from thin skin      Relevant Orders   CBC with Differential/Platelet     Other   Colon cancer screening    Due for 5 y screen colonoscopy (fam hx of colon cancer) with Dr Ardis Hughs Referral done Pt will call office to get that scheduled       Relevant Orders   Ambulatory referral to Gastroenterology   Encounter for screening mammogram for breast cancer    Mammogram ordered for [facility] and will be faxed as they are not on EMr Pt will call to schedule  Encouraged regular self breast exams       Relevant Orders   MM 3D SCREEN BREAST BILATERAL   Hyperlipidemia    Disc goals for lipids and reasons to control them Rev last labs with pt Rev low sat fat diet in detail Due for labs Diet is good HDL last year was over 60        Relevant Orders   Lipid panel   Routine general medical examination at a health care facility - Primary    Reviewed health habits including diet and exercise and skin cancer prevention Reviewed appropriate screening  tests for age  Also reviewed health mt list, fam hx and immunization status , as well as social and family history   Reviewed amw  See HPI Labs ordered Declines 2nd shingrix  Declines covid booster  Mammogram ordered-pt will schedule at [facility] Colonoscopy due for 5 y recall (fam hx)- referral is in and pt will call Dr Eugenia Pancoast office to schedule  dexa is up to date until nov  No falls or fx       Relevant Orders   CBC with Differential/Platelet   Comprehensive metabolic  panel   Lipid panel   TSH   Vitamin D deficiency    Level added to labs       Relevant Orders   VITAMIN D 25 Hydroxy (Vit-D Deficiency, Fractures)

## 2021-10-28 NOTE — Patient Instructions (Addendum)
Labs today  ? ?Take care of yourself  ? ?Please call [facility] to set up your colonoscopy  ?Tell them your issues with prep  ? ? ?Please schedule your mammogram ? ?Please call the location of your choice from the menu below to schedule your Mammogram and/or Bone Density appointment.   ? ?[place]  ? ?[facility]                ?      Phone:  [phone] ?[address]                               ?[place], [place] [address]                                                             ?Services: Traditional and 3D Mammogram, Bone Density  ? ?[facility]           ?      Phone: [phone] ?[address]                                                       ?[place], [place] [address]    ?Service: Bone Density ONLY  ? *this site does NOT perform mammograms ? ?[facility]                       ? Phone:  [phone] ?[address]                                  ?[place], [place] [address]                                            ?Services:  3D Mammogram and Bone Density  ? ? ?[place] ? ?[facility]   ?Phone:  [phone]   ?[place], [place] [address]                                            ?Services: 3D Mammogram and Bone Density ? ?[place] [facility])  ?Phone:  [phone]   ?[address]                        ?  [place], [place] [address]                                              ?Services:  3D Mammogram and Bone Density ? ?Due to there being a large influx of referrals the GI offices are currently scheduling appointments as far out as late Aug/Sept 2022. They have asked that you call their office regarding your referral and scheduling needs. Otherwise, they will reach out to you as soon as they are able. They are  working diligently to get patients called and scheduled as soon as possible. ? ?Colonoscopies will be called according to date/time of the referral entry as these are not of urgent need.   ? ?[facility]  [phone] ?[facility]  [phone] ?[facility]  [phone] ? ?[facility] can be considered as well. They may be able to book sooner appointments. You can reach [facility] at [phone].  ? ?Thank you for your patience and please let us know if you have any questions or concerns.  ? ? ?Thank You! ? ?- [facility] ? ?

## 2021-10-28 NOTE — Assessment & Plan Note (Signed)
Disc goals for lipids and reasons to control them ?Rev last labs with pt ?Rev low sat fat diet in detail ?Due for labs ?Diet is good ?HDL last year was over 60  ? ?

## 2021-10-28 NOTE — Assessment & Plan Note (Signed)
Mammogram ordered for [facility] and will be faxed as they are not on EMr ?Pt will call to schedule  ?Encouraged regular self breast exams  ?

## 2021-10-28 NOTE — Assessment & Plan Note (Signed)
Due for 5 y screen colonoscopy (fam hx of colon cancer) with Dr Ardis Hughs ?Referral done ?Pt will call office to get that scheduled  ?

## 2021-10-29 ENCOUNTER — Encounter: Payer: Self-pay | Admitting: *Deleted

## 2021-10-29 ENCOUNTER — Encounter: Payer: Self-pay | Admitting: Gastroenterology

## 2022-03-15 DIAGNOSIS — Z1231 Encounter for screening mammogram for malignant neoplasm of breast: Secondary | ICD-10-CM | POA: Diagnosis not present

## 2022-07-13 DIAGNOSIS — Z780 Asymptomatic menopausal state: Secondary | ICD-10-CM | POA: Diagnosis not present

## 2022-07-13 DIAGNOSIS — M85851 Other specified disorders of bone density and structure, right thigh: Secondary | ICD-10-CM | POA: Diagnosis not present

## 2022-07-13 DIAGNOSIS — M810 Age-related osteoporosis without current pathological fracture: Secondary | ICD-10-CM | POA: Diagnosis not present

## 2022-07-13 DIAGNOSIS — M85852 Other specified disorders of bone density and structure, left thigh: Secondary | ICD-10-CM | POA: Diagnosis not present

## 2022-07-13 LAB — HM DEXA SCAN

## 2022-07-14 ENCOUNTER — Encounter: Payer: Self-pay | Admitting: Family Medicine

## 2022-07-18 ENCOUNTER — Other Ambulatory Visit: Payer: Self-pay | Admitting: Family Medicine

## 2022-07-19 NOTE — Telephone Encounter (Signed)
Refill request Amoxil Last office visit 10/28/21 Last refill 09/11/21 #4/3

## 2022-08-03 ENCOUNTER — Encounter: Payer: Self-pay | Admitting: Family Medicine

## 2022-08-06 ENCOUNTER — Telehealth: Payer: Self-pay | Admitting: Family Medicine

## 2022-08-06 NOTE — Telephone Encounter (Addendum)
I spoke with Angelica Rivers; Angelica Rivers said she is not having CP,SOB or dizziness now. Last time had CP, SOB or dizziness end of Nov.  Angelica Rivers said her daughter has been encouraging Angelica Rivers to schedule appt for at least an EKG so Angelica Rivers called today to schedule. Angelica Rivers already has appt scheduled with Dr tower  on 08/09/22 at 10 AM..UC & ED precautions given and Angelica Rivers voiced understanding.sending note to Dr Glori Bickers and [employer] and will teams [employer].

## 2022-08-06 NOTE — Telephone Encounter (Signed)
Patient called and stated that she is having chest pains, shortness of breath and dizzy spells and having been going on for a while. Patient was sent to access nurse.

## 2022-08-06 NOTE — Telephone Encounter (Signed)
Agree with ER precautions  °Will see her then °

## 2022-08-06 NOTE — Telephone Encounter (Signed)
[facility] - Client TELEPHONE ADVICE RECORD [facility] Patient Name: Angelica Rivers Gender: Female DOB: [birth year] Age: 76 Y 3 M 18 D Return Phone Number: [phone] (Primary) Address: [place]  [address] Client [facility] - Client Client Site [place] Provider Angelica Rivers, Angelica Rivers - MD Contact Type Call Who Is Calling Patient / Member / Family / Caregiver Call Type Triage / Clinical Relationship To Patient Self Return Phone Number [phone] (Primary) Chief Complaint BREATHING - shortness of breath or sounds breathless Reason for Call Symptomatic / Request for [name] states that she has shortness of breath, dizziness, and chest pain. Translation No Nurse Assessment Nurse: Angelica Sites, RN, Angelica Rivers Date/Time (Eastern Time): 08/06/2022 10:39:19 AM Confirm and document reason for call. If symptomatic, describe symptoms. ---Caller states that she had shortness of breath, dizziness, and chest pain. Does the patient have any new or worsening symptoms? ---Yes Will a triage be completed? ---Yes Related visit to physician within the last 2 weeks? ---No Does the PT have any chronic conditions? (i.e. diabetes, asthma, this includes High risk factors for pregnancy, etc.) ---Yes List chronic conditions. ---D3 vitamin ASA every other day ABX required if having dental work; due to cardiac valve hx Is this a behavioral health or substance abuse call? ---No Guidelines Guideline Title Affirmed Question Affirmed Notes Nurse Date/Time (Eastern Time) Chest Pain [1] Chest pain lasts > 5 minutes AND [2] occurred > 3 days ago (72 hours) AND [3] NO chest pain or cardiac symptoms now Angelica Hash, RN, [place] 08/06/2022 10:42:26 AM PLEASE NOTE: All timestamps contained within this report are represented as [place] Standard Time. CONFIDENTIALTY  NOTICE: This fax transmission is intended only for the addressee. It contains information that is legally privileged, confidential or otherwise protected from use or disclosure. If you are not the intended recipient, you are strictly prohibited from reviewing, disclosing, copying using or disseminating any of this information or taking any action in reliance on or regarding this information. If you have received this fax in error, please notify us immediately by telephone so that we can arrange for its return to [place]. Phone: [phone], Toll-Free: [phone], Fax: [phone] Page: 2 of 2 Call Id: [id number] [place]. Time Angelica Rivers Time) Disposition Final User 08/06/2022 10:37:04 AM Send to Urgent Queue Angelica Rivers 08/06/2022 10:49:19 AM See PCP within 24 Hours Yes Angelica Sites, RN, Angelica Rivers Final Disposition 08/06/2022 10:49:19 AM See PCP within 24 Hours Yes [place], RN, Angelica Rivers Disagree/Comply Comply Caller Understands Yes PreDisposition Did not know what to do Care Advice Given Per Guideline SEE PCP WITHIN 24 HOURS: * IF OFFICE WILL BE OPEN: You need to be examined within the next 24 hours. Call your doctor (or NP/PA) when the office opens and make an appointment. CALL BACK IF: * Difficulty breathing or unusual sweating occurs * Chest pain increases in frequency, duration or severity * Chest pain lasts over 5 minutes CARE ADVICE given per Chest Pain (Adult) guideline. Comments User: Angelica Hamper, RN Date/Time Angelica Rivers Time): 08/06/2022 10:51:31 AM Denies current chest pain or sob. REports this is an intermittent situation, but chest pain has lasted longer than 5 mins when it happens. Referrals REFERRED TO PCP OFFIC

## 2022-08-09 ENCOUNTER — Encounter: Payer: Self-pay | Admitting: Family Medicine

## 2022-08-09 ENCOUNTER — Ambulatory Visit (INDEPENDENT_AMBULATORY_CARE_PROVIDER_SITE_OTHER): Payer: Medicare Other | Admitting: Family Medicine

## 2022-08-09 VITALS — BP 144/70 | HR 69 | Temp 97.3°F | Ht 65.0 in | Wt 119.5 lb

## 2022-08-09 DIAGNOSIS — R079 Chest pain, unspecified: Secondary | ICD-10-CM | POA: Diagnosis not present

## 2022-08-09 DIAGNOSIS — R0789 Other chest pain: Secondary | ICD-10-CM | POA: Diagnosis not present
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
## 2022-08-09 NOTE — Patient Instructions (Addendum)
I placed a referral to cardiology  If you don't get a call to schedule this this week let us know   If symptoms return and don't improve or if severe go to the hospital (closest ER )   We will send for your dexa report from [place] taking your calcium and vitamin   Take care of yourself

## 2022-08-09 NOTE — Assessment & Plan Note (Signed)
In pt with h/o mitral valve repair (for congenital problem) in 2004  Episodic chest pressure tends to happen when stressed (sometimes moving) and is midline , lasting less than 30 minutes  No sob  Some nausea once (she thought it was reflux) , no edema, pnd or orthopnea  Has had episode of dimmed vision/brief without syncope Pt thinks this may be from anxiety as well  EKG is wnl / reassuring Ref done to cardiology for eval and tx ER precautions disc in detail

## 2022-08-17 ENCOUNTER — Ambulatory Visit: Payer: Medicare Other | Attending: Cardiovascular Disease | Admitting: Cardiovascular Disease

## 2022-08-17 ENCOUNTER — Encounter: Payer: Self-pay | Admitting: Cardiovascular Disease

## 2022-08-17 VITALS — BP 137/77 | HR 84 | Ht 64.0 in | Wt 121.0 lb

## 2022-08-17 DIAGNOSIS — R079 Chest pain, unspecified: Secondary | ICD-10-CM

## 2022-08-17 MED ORDER — METOPROLOL TARTRATE 100 MG PO TABS: ORAL_TABLET | ORAL | 0 refills | Status: DC
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
## 2022-08-17 NOTE — Patient Instructions (Addendum)
Medication Instructions:  Take Metoprolol 100 mg two hours before CT when scheduled.   *If you need a refill on your cardiac medications before your next appointment, please call your pharmacy*   Lab Work: BMET today   If you have labs (blood work) drawn today and your tests are completely normal, you will receive your results only by: [place] (if you have MyChart) OR A paper copy in the mail If you have any lab test that is abnormal or we need to change your treatment, we will call you to review the results.   Testing/Procedures: Echocardiogram - Your physician has requested that you have an echocardiogram. Echocardiography is a painless test that uses sound waves to create images of your heart. It provides your doctor with information about the size and shape of your heart and how well your heart's chambers and valves are working. This procedure takes approximately one hour. There are no restrictions for this procedure.   Coronary CTA  Follow-Up: At [facility], you and your health needs are our priority.  As part of our continuing mission to provide you with exceptional heart care, we have created designated Provider Care Teams.  These Care Teams include your primary Cardiologist (physician) and Advanced Practice Providers (APPs -  Physician Assistants and Nurse Practitioners) who all work together to provide you with the care you need, when you need it.  We recommend signing up for the patient portal called "MyChart".  Sign up information is provided on this After Visit Summary.  MyChart is used to connect with patients for Virtual Visits (Telemedicine).  Patients are able to view lab/test results, encounter notes, upcoming appointments, etc.  Non-urgent messages can be sent to your provider as well.   To learn more about what you can do with MyChart, go to [email or web address].    Your next appointment:   2-3 month(s)  The format for your next appointment:   In  Person  Provider:   Sanda Klein, MD  Other Instructions   Your cardiac CT will be scheduled at one of the below locations:   [facility] [address] [place], [place] [address] [phone]   If scheduled at [facility], please arrive at the [facility] Entrance (Entrance C2) of [facility] 30 minutes prior to test start time. You can use the FREE valet parking offered at entrance C (encouraged to control the heart rate for the test)  Proceed to the [facility] Radiology Department (first floor) to check-in and test prep.  All radiology patients and guests should use entrance C2 at [facility], accessed from [facility], even though the hospital's physical address listed is [address]     Please follow these instructions carefully (unless otherwise directed):   On the Night Before the Test: Be sure to Drink plenty of water. Do not consume any caffeinated/decaffeinated beverages or chocolate 12 hours prior to your test. Do not take any antihistamines 12 hours prior to your test.  On the Day of the Test: Drink plenty of water until 1 hour prior to the test. Do not eat any food 1 hour prior to test. You may take your regular medications prior to the test.  Take metoprolol (Lopressor) two hours prior to test. HOLD Furosemide/Hydrochlorothiazide morning of the test. FEMALES- please wear underwire-free bra if available, avoid dresses & tight clothing      After the Test: Drink plenty of water. After receiving IV  contrast, you may experience a mild flushed feeling. This is normal. On occasion, you may experience a mild rash up to 24 hours after the test. This is not dangerous. If this occurs, you can take Benadryl 25 mg and increase your fluid intake. If you experience trouble breathing, this can be serious. If it is severe call 911 IMMEDIATELY. If it is mild, please call our office. If you take any  of these medications: Glipizide/Metformin, Avandament, Glucavance, please do not take 48 hours after completing test unless otherwise instructed.  We will call to schedule your test 2-4 weeks out understanding that some insurance companies will need an authorization prior to the service being performed.   For non-scheduling related questions, please contact the cardiac imaging nurse navigator should you have any questions/concerns: Marchia Bond, Cardiac Imaging Nurse Navigator Gordy Clement, Cardiac Imaging Nurse Navigator [facility] Direct Office Dial: [phone]   For scheduling needs, including cancellations and rescheduling, please call [place], [phone].

## 2022-08-18 LAB — BASIC METABOLIC PANEL
BUN/Creatinine Ratio: 16 (ref 12–28)
BUN: 16 mg/dL (ref 8–27)
CO2: 22 mmol/L (ref 20–29)
Calcium: 9.7 mg/dL (ref 8.7–10.3)
Chloride: 104 mmol/L (ref 96–106)
Creatinine, Ser: 1.01 mg/dL — ABNORMAL HIGH (ref 0.57–1.00)
Glucose: 84 mg/dL (ref 70–99)
Potassium: 4.6 mmol/L (ref 3.5–5.2)
Sodium: 140 mmol/L (ref 134–144)
eGFR: 58 mL/min/{1.73_m2} — ABNORMAL LOW (ref >59)

## 2022-08-19 ENCOUNTER — Encounter: Payer: Self-pay | Admitting: Family Medicine

## 2022-08-26 ENCOUNTER — Telehealth (HOSPITAL_COMMUNITY): Payer: Self-pay | Admitting: *Deleted

## 2022-08-26 NOTE — Telephone Encounter (Signed)
Patient's husband returning call regarding upcoming cardiac imaging study; pt's husband verbalizes understanding of appt date/time, parking situation and where to check in, pre-test NPO status and medications ordered, and verified current allergies; name and call back number provided for further questions should they arise  Gordy Clement RN Navigator Cardiac Imaging [facility] [phone] office [phone] cell  Patient to take '100mg'$  metoprolol tartrate two hours prior to her cardiac CT scan. Her husband is aware she is to arrive at 9:30am.

## 2022-08-26 NOTE — Telephone Encounter (Signed)
Attempted to call patient regarding upcoming cardiac CT appointment. °Left message on voicemail with name and callback number ° °Raia Amico RN Navigator Cardiac Imaging °[facility] °[phone] Office °[phone] Cell ° °

## 2022-08-27 ENCOUNTER — Ambulatory Visit (HOSPITAL_COMMUNITY)
Admission: RE | Admit: 2022-08-27 | Discharge: 2022-08-27 | Disposition: A | Discharge status: Home / Self Care | Payer: Medicare Other | Source: Ambulatory Visit | Attending: Cardiovascular Disease | Admitting: Cardiovascular Disease

## 2022-08-27 DIAGNOSIS — R079 Chest pain, unspecified: Secondary | ICD-10-CM

## 2022-08-27 DIAGNOSIS — I25119 Atherosclerotic heart disease of native coronary artery with unspecified angina pectoris: Secondary | ICD-10-CM | POA: Diagnosis not present

## 2022-08-27 MED ORDER — NITROGLYCERIN 0.4 MG SL SUBL
SUBLINGUAL_TABLET | SUBLINGUAL | Status: AC
  Filled 2022-08-27: qty 2

## 2022-08-27 MED ORDER — IOHEXOL 350 MG/ML SOLN
100.0000 mL | Freq: Once | INTRAVENOUS | Status: AC | PRN
  Administered 2022-08-27: 100 mL via INTRAVENOUS

## 2022-08-27 MED ORDER — NITROGLYCERIN 0.4 MG SL SUBL
0.8000 mg | SUBLINGUAL_TABLET | Freq: Once | SUBLINGUAL | Status: AC
  Administered 2022-08-27: 0.8 mg via SUBLINGUAL

## 2022-09-20 ENCOUNTER — Ambulatory Visit (HOSPITAL_COMMUNITY): Payer: Medicare Other | Attending: Cardiovascular Disease

## 2022-09-20 DIAGNOSIS — R079 Chest pain, unspecified: Secondary | ICD-10-CM | POA: Diagnosis not present

## 2022-09-20 LAB — ECHOCARDIOGRAM COMPLETE
Area-P 1/2: 4.31 cm2
MV VTI: 2.07 cm2
S' Lateral: 2.5 cm

## 2022-09-21 ENCOUNTER — Encounter: Payer: Self-pay | Admitting: *Deleted

## 2022-11-09 ENCOUNTER — Ambulatory Visit: Payer: Medicare Other | Attending: Cardiovascular Disease | Admitting: Cardiovascular Disease

## 2022-11-09 ENCOUNTER — Encounter: Payer: Self-pay | Admitting: Cardiovascular Disease

## 2022-11-09 VITALS — BP 120/68 | HR 80 | Wt 122.2 lb

## 2022-11-09 DIAGNOSIS — E7800 Pure hypercholesterolemia, unspecified: Secondary | ICD-10-CM

## 2022-11-09 DIAGNOSIS — Z9889 Other specified postprocedural states: Secondary | ICD-10-CM

## 2022-11-09 NOTE — Patient Instructions (Signed)
Medication Instructions:  No changes *If you need a refill on your cardiac medications before your next appointment, please call your pharmacy*  Follow-Up: At [facility], you and your health needs are our priority.  As part of our continuing mission to provide you with exceptional heart care, we have created designated Provider Care Teams.  These Care Teams include your primary Cardiologist (physician) and Advanced Practice Providers (APPs -  Physician Assistants and Nurse Practitioners) who all work together to provide you with the care you need, when you need it.  We recommend signing up for the patient portal called "MyChart".  Sign up information is provided on this After Visit Summary.  MyChart is used to connect with patients for Virtual Visits (Telemedicine).  Patients are able to view lab/test results, encounter notes, upcoming appointments, etc.  Non-urgent messages can be sent to your provider as well.   To learn more about what you can do with MyChart, go to [email or web address].    Your next appointment:    Follow up as needed with Dr Sallyanne Kuster
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
## 2022-11-23 ENCOUNTER — Telehealth: Payer: Self-pay | Admitting: Family Medicine

## 2022-11-23 NOTE — Telephone Encounter (Signed)
Contacted DRITA ATTIG to schedule their annual wellness visit. Appointment made for 12/16/2022.   [place] Direct Dial: [phone]

## 2022-12-13 ENCOUNTER — Telehealth: Payer: Self-pay | Admitting: Family Medicine

## 2022-12-13 DIAGNOSIS — D696 Thrombocytopenia, unspecified: Secondary | ICD-10-CM

## 2022-12-13 DIAGNOSIS — E7800 Pure hypercholesterolemia, unspecified: Secondary | ICD-10-CM

## 2022-12-13 DIAGNOSIS — E559 Vitamin D deficiency, unspecified: Secondary | ICD-10-CM

## 2022-12-13 NOTE — Telephone Encounter (Signed)
-----   Message from Alvina Chou sent at 11/29/2022 10:15 AM EDT ----- Regarding: Lab orders for Tuesday, 4.16.24 Patient is scheduled for CPX labs, please order future labs, Thanks , Camelia Eng

## 2022-12-14 ENCOUNTER — Other Ambulatory Visit (INDEPENDENT_AMBULATORY_CARE_PROVIDER_SITE_OTHER): Payer: Medicare Other

## 2022-12-14 DIAGNOSIS — E559 Vitamin D deficiency, unspecified: Secondary | ICD-10-CM

## 2022-12-14 DIAGNOSIS — E7800 Pure hypercholesterolemia, unspecified: Secondary | ICD-10-CM | POA: Diagnosis not present

## 2022-12-14 DIAGNOSIS — D696 Thrombocytopenia, unspecified: Secondary | ICD-10-CM | POA: Diagnosis not present

## 2022-12-14 LAB — LIPID PANEL
Cholesterol: 206 mg/dL — ABNORMAL HIGH (ref 0–200)
HDL: 73.9 mg/dL (ref >39.00)
LDL Cholesterol: 118 mg/dL — ABNORMAL HIGH (ref 0–99)
NonHDL: 132.35
Total CHOL/HDL Ratio: 3
Triglycerides: 74 mg/dL (ref 0.0–149.0)
VLDL: 14.8 mg/dL (ref 0.0–40.0)

## 2022-12-14 LAB — COMPREHENSIVE METABOLIC PANEL
ALT: 16 U/L (ref 0–35)
AST: 17 U/L (ref 0–37)
Albumin: 4.2 g/dL (ref 3.5–5.2)
Alkaline Phosphatase: 55 U/L (ref 39–117)
BUN: 16 mg/dL (ref 6–23)
CO2: 29 mEq/L (ref 19–32)
Calcium: 9.6 mg/dL (ref 8.4–10.5)
Chloride: 105 mEq/L (ref 96–112)
Creatinine, Ser: 0.99 mg/dL (ref 0.40–1.20)
GFR: 55.33 mL/min — ABNORMAL LOW (ref >60.00)
Glucose, Bld: 81 mg/dL (ref 70–99)
Potassium: 4.4 mEq/L (ref 3.5–5.1)
Sodium: 141 mEq/L (ref 135–145)
Total Bilirubin: 0.4 mg/dL (ref 0.2–1.2)
Total Protein: 6.7 g/dL (ref 6.0–8.3)

## 2022-12-14 LAB — CBC WITH DIFFERENTIAL/PLATELET
Basophils Absolute: 0 10*3/uL (ref 0.0–0.1)
Basophils Relative: 0.4 % (ref 0.0–3.0)
Eosinophils Absolute: 0.1 10*3/uL (ref 0.0–0.7)
Eosinophils Relative: 1.5 % (ref 0.0–5.0)
HCT: 41.3 % (ref 36.0–46.0)
Hemoglobin: 13.6 g/dL (ref 12.0–15.0)
Lymphocytes Relative: 42 % (ref 12.0–46.0)
Lymphs Abs: 2.1 10*3/uL (ref 0.7–4.0)
MCHC: 33 g/dL (ref 30.0–36.0)
MCV: 88 fl (ref 78.0–100.0)
Monocytes Absolute: 0.4 10*3/uL (ref 0.1–1.0)
Monocytes Relative: 8.2 % (ref 3.0–12.0)
Neutro Abs: 2.4 10*3/uL (ref 1.4–7.7)
Neutrophils Relative %: 47.9 % (ref 43.0–77.0)
Platelets: 145 10*3/uL — ABNORMAL LOW (ref 150.0–400.0)
RBC: 4.69 Mil/uL (ref 3.87–5.11)
RDW: 13.6 % (ref 11.5–15.5)
WBC: 5 10*3/uL (ref 4.0–10.5)

## 2022-12-14 LAB — TSH: TSH: 1.97 u[IU]/mL (ref 0.35–5.50)

## 2022-12-14 LAB — VITAMIN D 25 HYDROXY (VIT D DEFICIENCY, FRACTURES): VITD: 96.26 ng/mL (ref 30.00–100.00)

## 2022-12-16 ENCOUNTER — Ambulatory Visit (INDEPENDENT_AMBULATORY_CARE_PROVIDER_SITE_OTHER): Payer: Medicare Other

## 2022-12-16 VITALS — BP 130/68 | Ht 65.0 in | Wt 118.8 lb

## 2022-12-16 DIAGNOSIS — Z0000 Encounter for general adult medical examination without abnormal findings: Secondary | ICD-10-CM

## 2022-12-16 NOTE — Progress Notes (Signed)
Subjective:   Angelica Rivers is a 78 y.o. female who presents for Medicare Annual (Subsequent) preventive examination.  Review of Systems      Cardiac Risk Factors include: advanced age (>22men, >26 women);sedentary lifestyle     Objective:    Today's Vitals   12/16/22 1027  BP: 130/68  SpO2: 97%  Weight: 118 lb 12.8 oz (53.9 kg)  Height: 5\' 5"  (1.651 m)   Body mass index is 19.77 kg/m.     12/16/2022   10:37 AM 10/21/2021   10:32 AM 04/29/2020    2:57 PM 10/02/2018    3:49 PM 09/27/2017    9:48 AM 11/10/2016   10:37 AM 09/23/2016   11:35 AM  Advanced Directives  Does Patient Have a Medical Advance Directive? Yes Yes No No No No No  Type of Estate agent of [place];Living will Healthcare Power of [place];Living will       Does patient want to make changes to medical advance directive?  Yes (MAU/Ambulatory/Procedural Areas - Information given)       Copy of Healthcare Power of Attorney in Chart? No - copy requested        Would patient like information on creating a medical advance directive?   No - Patient declined No - Patient declined No - Patient declined      Current Medications (verified) Outpatient Encounter Medications as of 12/16/2022  Medication Sig   aspirin 81 MG chewable tablet Chew 81 mg by mouth every other day.   Cholecalciferol (VITAMIN D) 2000 UNITS CAPS Take 2 capsules by mouth daily.   Flaxseed, Linseed, (FLAX SEED OIL) 1000 MG CAPS Take 1 capsule by mouth daily.    amoxicillin (AMOXIL) 500 MG capsule TAKE 4 CAPSULES BY MOUTH BEFORE DENTAL APPOINTMENT (Patient not taking: Reported on 08/17/2022)   No facility-administered encounter medications on file as of 12/16/2022.    Allergies (verified) Neosporin [neomycin-bacitracin zn-polymyx] and Shingrix [zoster vac recomb adjuvanted]   History: Past Medical History:  Diagnosis Date   Anemia    History of repair of mitral valve    proph for dentist   Nephrolithiasis    Hx of    Osteopenia    Osteoporosis    fosamax for over 5 yrs.   Vaginal cyst    stable   Past Surgical History:  Procedure Laterality Date   CARDIAC SURGERY  1995   mitral valve repair carpenters ring   COLONOSCOPY     HEMORRHOID SURGERY     lithotripsy for kidney stones     MITRAL VALVE REPAIR  2004   ruptured ovarian cyst surgery     skin graft dog bite  2010   TONSILLECTOMY     Family History  Problem Relation Age of Onset   Colon cancer Mother    Diabetes Brother    Esophageal cancer Neg Hx    Rectal cancer Neg Hx    Stomach cancer Neg Hx    Social History   Socioeconomic History   Marital status: Married    Spouse name: Not on file   Number of children: Not on file   Years of education: Not on file   Highest education level: Not on file  Occupational History   Not on file  Tobacco Use   Smoking status: Never   Smokeless tobacco: Never  Vaping Use   Vaping Use: Never used  Substance and Sexual Activity   Alcohol use: No    Alcohol/week: 0.0 standard drinks of  alcohol   Drug use: No   Sexual activity: Never  Other Topics Concern   Not on file  Social History Narrative   Not on file   Social Determinants of Health   Financial Resource Strain: Low Risk  (12/16/2022)   Overall Financial Resource Strain (CARDIA)    Difficulty of Paying Living Expenses: Not hard at all  Food Insecurity: No Food Insecurity (12/16/2022)   Hunger Vital Sign    Worried About Running Out of Food in the Last Year: Never true    Ran Out of Food in the Last Year: Never true  Transportation Needs: No Transportation Needs (12/16/2022)   PRAPARE - Administrator, Civil Service (Medical): No    Lack of Transportation (Non-Medical): No  Physical Activity: Inactive (12/16/2022)   Exercise Vital Sign    Days of Exercise per Week: 0 days    Minutes of Exercise per Session: 0 min  Stress: No Stress Concern Present (12/16/2022)   [employer] of Occupational Health - Occupational  Stress Questionnaire    Feeling of Stress : Not at all  Social Connections: Moderately Isolated (12/16/2022)   Social Connection and Isolation Panel [NHANES]    Frequency of Communication with Friends and Family: More than three times a week    Frequency of Social Gatherings with Friends and Family: More than three times a week    Attends Religious Services: Never    Database administrator or Organizations: No    Attends Engineer, structural: Never    Marital Status: Married    Tobacco Counseling Counseling given: Not Answered   Clinical Intake:  Pre-visit preparation completed: Yes  Pain : No/denies pain     Nutritional Risks: None Diabetes: No  How often do you need to have someone help you when you read instructions, pamphlets, or other written materials from your doctor or pharmacy?: 1 - Never  Diabetic? no  Interpreter Needed?: No  Information entered by :: C.Elowyn Raupp LPN   Activities of Daily Living    12/16/2022   10:39 AM  In your present state of health, do you have any difficulty performing the following activities:  Hearing? 0  Vision? 0  Difficulty concentrating or making decisions? 0  Walking or climbing stairs? 0  Dressing or bathing? 0  Doing errands, shopping? 0  Preparing Food and eating ? N  Using the Toilet? N  In the past six months, have you accidently leaked urine? N  Do you have problems with loss of bowel control? N  Managing your Medications? N  Managing your Finances? N  Housekeeping or managing your Housekeeping? N    Patient Care Team: Tower, Audrie Gallus, MD as PCP - General  Indicate any recent Medical Services you may have received from other than [facility] providers in the past year (date may be approximate).     Assessment:   This is a routine wellness examination for Angelica Rivers.  Hearing/Vision screen Hearing Screening - Comments:: No aids Vision Screening - Comments:: Glasses -  [facility]  Dietary issues and exercise  activities discussed: Current Exercise Habits: The patient does not participate in regular exercise at present, Exercise limited by: None identified   Goals Addressed             This Visit's Progress    Patient Stated       No new goals       Depression Screen    12/16/2022   10:37 AM 10/21/2021  10:36 AM 04/29/2020    3:00 PM 10/02/2018   11:04 AM 09/27/2017    9:46 AM 09/23/2016   11:20 AM 09/20/2016    9:24 AM  PHQ 2/9 Scores  PHQ - 2 Score 0 0 0 0 0 0 0  PHQ- 9 Score   0 0 0      Fall Risk    12/16/2022   10:38 AM 10/21/2021   10:33 AM 04/29/2020    3:00 PM 10/02/2018   11:04 AM 09/27/2017    9:46 AM  Fall Risk   Falls in the past year? 0 0 0 0 Yes  Comment     pt fell over cord in yard; caused sprain to left thumb  Number falls in past yr: 0 0 0  1  Injury with Fall? 0 0 0  Yes  Risk for fall due to : No Fall Risks No Fall Risks No Fall Risks    Follow up Falls prevention discussed;Falls evaluation completed Falls prevention discussed Falls evaluation completed;Falls prevention discussed      FALL RISK PREVENTION PERTAINING TO THE HOME:  Any stairs in or around the home? Yes  If so, are there any without handrails? No  Home free of loose throw rugs in walkways, pet beds, electrical cords, etc? Yes  Adequate lighting in your home to reduce risk of falls? Yes   ASSISTIVE DEVICES UTILIZED TO PREVENT FALLS:  Life alert? No  Use of a cane, walker or w/c? No  Grab bars in the bathroom? Yes  Shower chair or bench in shower? Yes  Elevated toilet seat or a handicapped toilet? Yes   TIMED UP AND GO:  Was the test performed? Yes .  Length of time to ambulate 10 feet: 22 sec.   Gait steady and fast without use of assistive device  Cognitive Function:    04/29/2020    3:04 PM 10/02/2018   11:05 AM 09/27/2017    9:48 AM 09/23/2016   11:20 AM  MMSE - Mini Mental State Exam  Orientation to time Orientation to Place Registration Attention/  Calculation 5 0 0 0  Recall Language- name 2 objects  0 0 0  Language- repeat Language- follow 3 step command  Language- read & follow direction  0 0 0  Write a sentence  0 0 0  Copy design  0 0 0  Total score  12/16/2022   10:39 AM  6CIT Screen  What Year? 4 points  What month? 3 points  What time? 0 points  Count back from 20 0 points  Months in reverse 0 points  Repeat phrase 0 points  Total Score 7 points    Immunizations Immunization History  Administered Date(s) Administered   19-influenza Whole 06/14/2015   Fluad Quad(high Dose 65+) 05/14/2019, 07/13/2021   Influenza Whole 09/08/2006   Influenza, High Dose Seasonal PF 06/21/2014, 06/23/2016, 06/14/2017, 06/11/2018   Influenza-Unspecified 05/30/2013   Moderna Sars-Covid-2 Vaccination 10/30/2019   Pneumococcal Conjugate-13 12/17/2013   Pneumococcal Polysaccharide-23 10/13/2011   Td 04/20/2002, 02/27/2009   Tdap 12/25/2012, 03/06/2014   Zoster Recombinat (Shingrix) 01/05/2017   Zoster, Live 01/24/2013    TDAP status: Up to date  Flu Vaccine status: Up to date  Pneumococcal vaccine status: Up to date  Covid-19  vaccine status: Declined, Education has been provided regarding the importance of this vaccine but patient still declined. Advised may receive this vaccine at local pharmacy or Health Dept.or vaccine clinic. Aware to provide a copy of the vaccination record if obtained from local pharmacy or Health Dept. Verbalized acceptance and understanding.  Qualifies for Shingles Vaccine? Yes   Zostavax completed Yes   Shingrix Completed?: No.    Education has been provided regarding the importance of this vaccine. Patient has been advised to call insurance company to determine out of pocket expense if they have not yet received this vaccine. Advised may also receive vaccine at local pharmacy or Health Dept. Verbalized acceptance and understanding.  Screening Tests Health  Maintenance  Topic Date Due   Zoster Vaccines- Shingrix (2 of 2) 03/02/2017   COLONOSCOPY (Pts 45-19yrs Insurance coverage will need to be confirmed)  11/10/2021   COVID-19 Vaccine (2 - 2023-24 season) 04/30/2022   MAMMOGRAM  03/16/2023   INFLUENZA VACCINE  03/31/2023   Medicare Annual Wellness (AWV)  12/16/2023   DTaP/Tdap/Td (5 - Td or Tdap) 03/06/2024   DEXA SCAN  07/13/2024   Pneumonia Vaccine 26+ Years old  Completed   Hepatitis C Screening  Completed   HPV VACCINES  Aged Out   PAP SMEAR-Modifier  Discontinued    Health Maintenance  Health Maintenance Due  Topic Date Due   Zoster Vaccines- Shingrix (2 of 2) 03/02/2017   COLONOSCOPY (Pts 45-23yrs Insurance coverage will need to be confirmed)  11/10/2021   COVID-19 Vaccine (2 - 2023-24 season) 04/30/2022    Colorectal cancer screening: No longer required.   Mammogram status: Completed 07/172023. Repeat every year  Bone Density status: Completed 07/13/2322. Results reflect: Bone density results: OSTEOPENIA. Repeat every 2 years.  Lung Cancer Screening: (Low Dose CT Chest recommended if Age 3-80 years, 30 pack-year currently smoking OR have quit w/in 15years.) does not qualify.   Lung Cancer Screening Referral: no  Additional Screening:  Hepatitis C Screening: does qualify; Completed 09/23/16  Vision Screening: Recommended annual ophthalmology exams for early detection of glaucoma and other disorders of the eye. Is the patient up to date with their annual eye exam?  Yes  Who is the provider or what is the name of the office in which the patient attends annual eye exams? [facility] If pt is not established with a provider, would they like to be referred to a provider to establish care? No .   Dental Screening: Recommended annual dental exams for proper oral hygiene  Community Resource Referral / Chronic Care Management: CRR required this visit?  No   CCM required this visit?  No      Plan:     I have  personally reviewed and noted the following in the patient's chart:   Medical and social history Use of alcohol, tobacco or illicit drugs  Current medications and supplements including opioid prescriptions. Patient is not currently taking opioid prescriptions. Functional ability and status Nutritional status Physical activity Advanced directives List of other physicians Hospitalizations, surgeries, and ER visits in previous 12 months Vitals Screenings to include cognitive, depression, and falls Referrals and appointments  In addition, I have reviewed and discussed with patient certain preventive protocols, quality metrics, and best practice recommendations. A written personalized care plan for preventive services as well as general preventive health recommendations were provided to patient.     Maryan Puls, LPN   12/06/8117   Nurse Notes: 6 CIT score 18

## 2022-12-16 NOTE — Patient Instructions (Signed)
Angelica Rivers , Thank you for taking time to come for your Medicare Wellness Visit. I appreciate your ongoing commitment to your health goals. Please review the following plan we discussed and let me know if I can assist you in the future.   These are the goals we discussed:  Goals      DIET - INCREASE WATER INTAKE     Starting 10/02/2018, I will continue to drink at least 6-8 glasses of water daily.      Patient Stated     04/29/2020, I will continue to maintain and continue medications as prescribed.      Patient Stated     Would like to maintain current routine     Patient Stated     No new goals        This is a list of the screening recommended for you and due dates:  Health Maintenance  Topic Date Due   Zoster (Shingles) Vaccine (2 of 2) 03/02/2017   Colon Cancer Screening  11/10/2021   COVID-19 Vaccine (2 - 2023-24 season) 04/30/2022   Mammogram  03/16/2023   Flu Shot  03/31/2023   Medicare Annual Wellness Visit  12/16/2023   DTaP/Tdap/Td vaccine (5 - Td or Tdap) 03/06/2024   DEXA scan (bone density measurement)  07/13/2024   Pneumonia Vaccine  Completed   Hepatitis C Screening: USPSTF Recommendation to screen - Ages 93-79 yo.  Completed   HPV Vaccine  Aged Out   Pap Smear  Discontinued    Advanced directives: none  Conditions/risks identified: Aim for 30 minutes of exercise or brisk walking, 6-8 glasses of water, and 5 servings of fruits and vegetables each day.   Next appointment: Follow up in one year for your annual wellness visit 12/20/23 10:15 in-person   Preventive Care 65 Years and Older, Female Preventive care refers to lifestyle choices and visits with your health care provider that can promote health and wellness. What does preventive care include? A yearly physical exam. This is also called an annual well check. Dental exams once or twice a year. Routine eye exams. Ask your health care provider how often you should have your eyes checked. Personal  lifestyle choices, including: Daily care of your teeth and gums. Regular physical activity. Eating a healthy diet. Avoiding tobacco and drug use. Limiting alcohol use. Practicing safe sex. Taking low-dose aspirin every day. Taking vitamin and mineral supplements as recommended by your health care provider. What happens during an annual well check? The services and screenings done by your health care provider during your annual well check will depend on your age, overall health, lifestyle risk factors, and family history of disease. Counseling  Your health care provider may ask you questions about your: Alcohol use. Tobacco use. Drug use. Emotional well-being. Home and relationship well-being. Sexual activity. Eating habits. History of falls. Memory and ability to understand (cognition). Work and work Astronomer. Reproductive health. Screening  You may have the following tests or measurements: Height, weight, and BMI. Blood pressure. Lipid and cholesterol levels. These may be checked every 5 years, or more frequently if you are over 44 years old. Skin check. Lung cancer screening. You may have this screening every year starting at age 55 if you have a 30-pack-year history of smoking and currently smoke or have quit within the past 15 years. Fecal occult blood test (FOBT) of the stool. You may have this test every year starting at age 59. Flexible sigmoidoscopy or colonoscopy. You may have a sigmoidoscopy  every 5 years or a colonoscopy every 10 years starting at age 8. Hepatitis C blood test. Hepatitis B blood test. Sexually transmitted disease (STD) testing. Diabetes screening. This is done by checking your blood sugar (glucose) after you have not eaten for a while (fasting). You may have this done every 1-3 years. Bone density scan. This is done to screen for osteoporosis. You may have this done starting at age 62. Mammogram. This may be done every 1-2 years. Talk to your  health care provider about how often you should have regular mammograms. Talk with your health care provider about your test results, treatment options, and if necessary, the need for more tests. Vaccines  Your health care provider may recommend certain vaccines, such as: Influenza vaccine. This is recommended every year. Tetanus, diphtheria, and acellular pertussis (Tdap, Td) vaccine. You may need a Td booster every 10 years. Zoster vaccine. You may need this after age 73. Pneumococcal 13-valent conjugate (PCV13) vaccine. One dose is recommended after age 19. Pneumococcal polysaccharide (PPSV23) vaccine. One dose is recommended after age 72. Talk to your health care provider about which screenings and vaccines you need and how often you need them. This information is not intended to replace advice given to you by your health care provider. Make sure you discuss any questions you have with your health care provider. Document Released: 09/12/2015 Document Revised: 05/05/2016 Document Reviewed: 06/17/2015 Elsevier Interactive Patient Education  2017 [place] Prevention in the Home Falls can cause injuries. They can happen to people of all ages. There are many things you can do to make your home safe and to help prevent falls. What can I do on the outside of my home? Regularly fix the edges of walkways and driveways and fix any cracks. Remove anything that might make you trip as you walk through a door, such as a raised step or threshold. Trim any bushes or trees on the path to your home. Use bright outdoor lighting. Clear any walking paths of anything that might make someone trip, such as rocks or tools. Regularly check to see if handrails are loose or broken. Make sure that both sides of any steps have handrails. Any raised decks and porches should have guardrails on the edges. Have any leaves, snow, or ice cleared regularly. Use sand or salt on walking paths during winter. Clean  up any spills in your garage right away. This includes oil or grease spills. What can I do in the bathroom? Use night lights. Install grab bars by the toilet and in the tub and shower. Do not use towel bars as grab bars. Use non-skid mats or decals in the tub or shower. If you need to sit down in the shower, use a plastic, non-slip stool. Keep the floor dry. Clean up any water that spills on the floor as soon as it happens. Remove soap buildup in the tub or shower regularly. Attach bath mats securely with double-sided non-slip rug tape. Do not have throw rugs and other things on the floor that can make you trip. What can I do in the bedroom? Use night lights. Make sure that you have a light by your bed that is easy to reach. Do not use any sheets or blankets that are too big for your bed. They should not hang down onto the floor. Have a firm chair that has side arms. You can use this for support while you get dressed. Do not have throw rugs and other things  on the floor that can make you trip. What can I do in the kitchen? Clean up any spills right away. Avoid walking on wet floors. Keep items that you use a lot in easy-to-reach places. If you need to reach something above you, use a strong step stool that has a grab bar. Keep electrical cords out of the way. Do not use floor polish or wax that makes floors slippery. If you must use wax, use non-skid floor wax. Do not have throw rugs and other things on the floor that can make you trip. What can I do with my stairs? Do not leave any items on the stairs. Make sure that there are handrails on both sides of the stairs and use them. Fix handrails that are broken or loose. Make sure that handrails are as long as the stairways. Check any carpeting to make sure that it is firmly attached to the stairs. Fix any carpet that is loose or worn. Avoid having throw rugs at the top or bottom of the stairs. If you do have throw rugs, attach them to the  floor with carpet tape. Make sure that you have a light switch at the top of the stairs and the bottom of the stairs. If you do not have them, ask someone to add them for you. What else can I do to help prevent falls? Wear shoes that: Do not have high heels. Have rubber bottoms. Are comfortable and fit you well. Are closed at the toe. Do not wear sandals. If you use a stepladder: Make sure that it is fully opened. Do not climb a closed stepladder. Make sure that both sides of the stepladder are locked into place. Ask someone to hold it for you, if possible. Clearly mark and make sure that you can see: Any grab bars or handrails. First and last steps. Where the edge of each step is. Use tools that help you move around (mobility aids) if they are needed. These include: Canes. Walkers. Scooters. Crutches. Turn on the lights when you go into a dark area. Replace any light bulbs as soon as they burn out. Set up your furniture so you have a clear path. Avoid moving your furniture around. If any of your floors are uneven, fix them. If there are any pets around you, be aware of where they are. Review your medicines with your doctor. Some medicines can make you feel dizzy. This can increase your chance of falling. Ask your doctor what other things that you can do to help prevent falls. This information is not intended to replace advice given to you by your health care provider. Make sure you discuss any questions you have with your health care provider. Document Released: 06/12/2009 Document Revised: 01/22/2016 Document Reviewed: 09/20/2014 Elsevier Interactive Patient Education  2017 [employer].

## 2022-12-22 ENCOUNTER — Ambulatory Visit (INDEPENDENT_AMBULATORY_CARE_PROVIDER_SITE_OTHER): Payer: Medicare Other | Admitting: Family Medicine

## 2022-12-22 ENCOUNTER — Encounter: Payer: Self-pay | Admitting: Family Medicine

## 2022-12-22 VITALS — BP 106/68 | HR 70 | Temp 97.7°F | Ht 61.0 in | Wt 119.1 lb

## 2022-12-22 DIAGNOSIS — E559 Vitamin D deficiency, unspecified: Secondary | ICD-10-CM | POA: Diagnosis not present

## 2022-12-22 DIAGNOSIS — M810 Age-related osteoporosis without current pathological fracture: Secondary | ICD-10-CM

## 2022-12-22 DIAGNOSIS — E7800 Pure hypercholesterolemia, unspecified: Secondary | ICD-10-CM

## 2022-12-22 DIAGNOSIS — Z1211 Encounter for screening for malignant neoplasm of colon: Secondary | ICD-10-CM | POA: Diagnosis not present

## 2022-12-22 DIAGNOSIS — Z0000 Encounter for general adult medical examination without abnormal findings: Secondary | ICD-10-CM

## 2022-12-22 DIAGNOSIS — D696 Thrombocytopenia, unspecified: Secondary | ICD-10-CM | POA: Diagnosis not present

## 2022-12-22 MED ORDER — ALENDRONATE SODIUM 70 MG PO TABS: 70.0000 mg | ORAL_TABLET | ORAL | 11 refills | Status: DC

## 2022-12-22 NOTE — Assessment & Plan Note (Signed)
Disc goals for lipids and reasons to control them Rev last labs with pt Rev low sat fat diet in detail  Good HDL  Stable LDL of 118  Rev cardiology notes and ca score (very low) Reassuring

## 2022-12-22 NOTE — Assessment & Plan Note (Signed)
Colonoscopy 10/2016 with one adenomatous polyp Had 5 y recall and she declines Thought her mother had colon cancer-now finds out not  Aunt had it   Disc pros/cons Declines screening at this time but will reach out of she changes her mind

## 2022-12-22 NOTE — Assessment & Plan Note (Signed)
Vitamin D level is therapeutic with current supplementation Disc importance of this to bone and overall health Last vitamin D Lab Results  Component Value Date   VD25OH 96.26 12/14/2022    Pt has OP

## 2022-12-22 NOTE — Assessment & Plan Note (Addendum)
Reviewed health habits including diet and exercise and skin cancer prevention Reviewed appropriate screening tests for age  Also reviewed health mt list, fam hx and immunization status , as well as social and family history   See HPI Labs reviewed and ordered Declines 5 y recall colonoscopy  Corrected fam hx -mother did not have colon cancer  Mammogram utd 02/2022 and she already has next one scheduled Dexa utd 06/2022-disc starting alendronate for fx prev No falls or fx and D level is tx No derm issues PHQ score of 0 Low coronary calcium score

## 2022-12-22 NOTE — Progress Notes (Signed)
Subjective:    Patient ID: Angelica Rivers, female    DOB: 1946-07-19, 77 y.o.   MRN: [id number]  HPI Here for health maintenance exam and to review chronic medical problems    Wt Readings from Last 3 Encounters:  12/22/22 119 lb 2 oz (54 kg)  12/16/22 118 lb 12.8 oz (53.9 kg)  11/09/22 122 lb 3.2 oz (55.4 kg)   22.51 kg/m  Vitals:   12/22/22 1026  BP: 106/68  Pulse: 70  Temp: 97.7 F (36.5 C)  SpO2: 99%   Immunization History  Administered Date(s) Administered   19-influenza Whole 06/14/2015   Fluad Quad(high Dose 65+) 05/14/2019, 07/13/2021   Influenza Whole 09/08/2006   Influenza, High Dose Seasonal PF 06/21/2014, 06/23/2016, 06/14/2017, 06/11/2018   Influenza-Unspecified 05/30/2013   Moderna Sars-Covid-2 Vaccination 10/30/2019   Pneumococcal Conjugate-13 12/17/2013   Pneumococcal Polysaccharide-23 10/13/2011   Td 04/20/2002, 02/27/2009   Tdap 12/25/2012, 03/06/2014   Zoster Recombinat (Shingrix) 01/05/2017   Zoster, Live 01/24/2013   Health Maintenance Due  Topic Date Due   COLONOSCOPY (Pts 45-62yrs Insurance coverage will need to be confirmed)  11/10/2021   Keeping busy  Lots of time with grand kids and their sports  That is a lot of fun   Feeling good  Drinks a lot of water  That makes her urinate more  Nocturia  times one  No uti symptoms  Had colonoscopy 10/2016 with 5 y recall  Polyps Mother - had cancer - thought it was NOT colon cancer Aunt had colon cancer  Dr Christella Hartigan    Mammogram 02/2022 Self breast exam- no lumps    Dexa [id number]  OP in forearm  Falls: none Fractures:none  Supplements  Last vitamin D Lab Results  Component Value Date   VD25OH 96.26 12/14/2022    Disc importance of this to bone and overall health Up and down the stairs in her house often  Exercise : housework   Skin care: no problems   Mood      12/16/2022   10:37 AM 10/21/2021   10:36 AM 04/29/2020    3:00 PM 10/02/2018   11:04 AM 09/27/2017    9:46 AM   Depression screen PHQ 2/9  Decreased Interest 0 0 0 0 0  Down, Depressed, Hopeless 0 0 0 0 0  PHQ - 2 Score 0 0 0 0 0  Altered sleeping   0 0 0  Tired, decreased energy   0 0 0  Change in appetite   0 0 0  Feeling bad or failure about yourself    0 0 0  Trouble concentrating   0 0 0  Moving slowly or fidgety/restless   0 0 0  Suicidal thoughts   0 0 0  PHQ-9 Score   0 0 0  Difficult doing work/chores   Not difficult at all Not difficult at all Not difficult at all     H/o low platelets Lab Results  Component Value Date   WBC 5.0 12/14/2022   HGB 13.6 12/14/2022   HCT 41.3 12/14/2022   MCV 88.0 12/14/2022   PLT 145.0 (L) 12/14/2022  No problems   Hyperlipidemia  Lab Results  Component Value Date   CHOL 206 (H) 12/14/2022   CHOL 201 (H) 10/28/2021   CHOL 192 04/25/2020   Lab Results  Component Value Date   HDL 73.90 12/14/2022   HDL 69.70 10/28/2021   HDL 68.20 04/25/2020   Lab Results  Component Value Date  LDLCALC 118 (H) 12/14/2022   LDLCALC 118 (H) 10/28/2021   LDLCALC 108 (H) 04/25/2020   Lab Results  Component Value Date   TRIG 74.0 12/14/2022   TRIG 65.0 10/28/2021   TRIG 75.0 04/25/2020   Lab Results  Component Value Date   CHOLHDL 3 12/14/2022   CHOLHDL 3 10/28/2021   CHOLHDL 3 04/25/2020   Lab Results  Component Value Date   LDLDIRECT 117.7 10/06/2011   LDLDIRECT 127.8 09/22/2010   LDLDIRECT 120.8 09/12/2008    Had a very low coronary ca score  Saw cardiology in march  Last metabolic panel Lab Results  Component Value Date   GLUCOSE 81 12/14/2022   NA 141 12/14/2022   K 4.4 12/14/2022   CL 105 12/14/2022   CO2 29 12/14/2022   BUN 16 12/14/2022   CREATININE 0.99 12/14/2022   EGFR 58 (L) 08/17/2022   CALCIUM 9.6 12/14/2022   PROT 6.7 12/14/2022   ALBUMIN 4.2 12/14/2022   BILITOT 0.4 12/14/2022   ALKPHOS 55 12/14/2022   AST 17 12/14/2022   ALT 16 12/14/2022   ANIONGAP 7 01/24/2016   Lab Results  Component Value Date    TSH 1.97 12/14/2022    Patient Active Problem List   Diagnosis Date Noted   Encounter for screening mammogram for breast cancer 10/28/2021   Postmenopausal bleeding 05/15/2020   Uterine prolapse 04/30/2020   Estrogen deficiency 09/15/2015   Colon cancer screening 12/17/2013   Encounter for Medicare annual wellness exam 12/08/2013   Routine general medical examination at a health care facility 12/16/2012   Gynecological examination 10/13/2011   Vitamin D deficiency 09/23/2009   Thrombocytopenia (HCC) 09/23/2009   Hyperlipidemia 09/12/2008   FIBROCYSTIC BREAST DISEASE 09/08/2007   Osteoporosis 09/08/2007   NEPHROLITHIASIS, HX OF 09/08/2007   Past Medical History:  Diagnosis Date   Anemia    History of repair of mitral valve    proph for dentist   Nephrolithiasis    Hx of   Osteopenia    Osteoporosis    fosamax for over 5 yrs.   Vaginal cyst    stable   Past Surgical History:  Procedure Laterality Date   CARDIAC SURGERY  1995   mitral valve repair carpenters ring   COLONOSCOPY     HEMORRHOID SURGERY     lithotripsy for kidney stones     MITRAL VALVE REPAIR  2004   ruptured ovarian cyst surgery     skin graft dog bite  2010   TONSILLECTOMY     Social History   Tobacco Use   Smoking status: Never   Smokeless tobacco: Never  Vaping Use   Vaping Use: Never used  Substance Use Topics   Alcohol use: No    Alcohol/week: 0.0 standard drinks of alcohol   Drug use: No   Family History  Problem Relation Age of Onset   Colon cancer Mother    Diabetes Brother    Esophageal cancer Neg Hx    Rectal cancer Neg Hx    Stomach cancer Neg Hx    Allergies  Allergen Reactions   Neosporin [Neomycin-Bacitracin Zn-Polymyx] Other (See Comments)    Festers up   Shingrix [Zoster Vac Recomb Adjuvanted]     Rash    Current Outpatient Medications on File Prior to Visit  Medication Sig Dispense Refill   aspirin 81 MG chewable tablet Chew 81 mg by mouth every other day.      Cholecalciferol (VITAMIN D) 2000 UNITS CAPS Take 2 capsules by  mouth daily.     Flaxseed, Linseed, (FLAX SEED OIL) 1000 MG CAPS Take 1 capsule by mouth daily.      amoxicillin (AMOXIL) 500 MG capsule TAKE 4 CAPSULES BY MOUTH BEFORE DENTAL APPOINTMENT (Patient not taking: Reported on 12/22/2022) 4 capsule 3   No current facility-administered medications on file prior to visit.      Review of Systems  Constitutional:  Negative for activity change, appetite change, fatigue, fever and unexpected weight change.  HENT:  Negative for congestion, ear pain, rhinorrhea, sinus pressure and sore throat.   Eyes:  Negative for pain, redness and visual disturbance.  Respiratory:  Negative for cough, shortness of breath and wheezing.   Cardiovascular:  Negative for chest pain and palpitations.  Gastrointestinal:  Negative for abdominal pain, blood in stool, constipation and diarrhea.  Endocrine: Negative for polydipsia and polyuria.  Genitourinary:  Negative for dysuria, frequency and urgency.  Musculoskeletal:  Positive for arthralgias. Negative for back pain and myalgias.  Skin:  Negative for pallor and rash.  Allergic/Immunologic: Negative for environmental allergies.  Neurological:  Negative for dizziness, syncope and headaches.  Hematological:  Negative for adenopathy. Does not bruise/bleed easily.  Psychiatric/Behavioral:  Negative for decreased concentration and dysphoric mood. The patient is not nervous/anxious.        Objective:   Physical Exam Constitutional:      General: She is not in acute distress.    Appearance: Normal appearance. She is well-developed and normal weight. She is not ill-appearing or diaphoretic.  HENT:     Head: Normocephalic and atraumatic.     Right Ear: Tympanic membrane, ear canal and external ear normal.     Left Ear: Tympanic membrane, ear canal and external ear normal.     Nose: Nose normal. No congestion.     Mouth/Throat:     Mouth: Mucous membranes are  moist.     Pharynx: Oropharynx is clear. No posterior oropharyngeal erythema.  Eyes:     General: No scleral icterus.    Extraocular Movements: Extraocular movements intact.     Conjunctiva/sclera: Conjunctivae normal.     Pupils: Pupils are equal, round, and reactive to light.  Neck:     Thyroid: No thyromegaly.     Vascular: No carotid bruit or JVD.  Cardiovascular:     Rate and Rhythm: Normal rate and regular rhythm.     Pulses: Normal pulses.     Heart sounds: Normal heart sounds.     No gallop.  Pulmonary:     Effort: Pulmonary effort is normal. No respiratory distress.     Breath sounds: Normal breath sounds. No wheezing.     Comments: Good air exch Chest:     Chest wall: No tenderness.  Abdominal:     General: Bowel sounds are normal. There is no distension or abdominal bruit.     Palpations: Abdomen is soft. There is no mass.     Tenderness: There is no abdominal tenderness.     Hernia: No hernia is present.  Genitourinary:    Comments: Breast exam: No mass, nodules, thickening, tenderness, bulging, retraction, inflamation, nipple discharge or skin changes noted.  No axillary or clavicular LA.     Musculoskeletal:        General: No tenderness. Normal range of motion.     Cervical back: Normal range of motion and neck supple. No rigidity. No muscular tenderness.     Right lower leg: No edema.     Left lower leg: No edema.  Comments: No kyphosis   Lymphadenopathy:     Cervical: No cervical adenopathy.  Skin:    General: Skin is warm and dry.     Coloration: Skin is not pale.     Findings: No erythema or rash.     Comments: Solar lentigines diffusely Some angiomas  Neurological:     Mental Status: She is alert. Mental status is at baseline.     Cranial Nerves: No cranial nerve deficit.     Motor: No abnormal muscle tone.     Coordination: Coordination normal.     Gait: Gait normal.     Deep Tendon Reflexes: Reflexes are normal and symmetric. Reflexes normal.   Psychiatric:        Mood and Affect: Mood normal.        Cognition and Memory: Cognition and memory normal.           Assessment & Plan:   Problem List Items Addressed This Visit       Musculoskeletal and Integument   Osteoporosis    Dexa 06/2022  No falls or fx Has OP in forearm  Disc tx opt Will try alendronate weekly (rev poss side eff)  No planned dental work Disc strength building exercise  Discussed vit D intake/ level is tx  Re check at 2 y      Relevant Medications   alendronate (FOSAMAX) 70 MG tablet     Hematopoietic and Hemostatic   Thrombocytopenia (HCC)    Pl ct 145 (up) No symptoms Will continue to monitor         Other   Colon cancer screening    Colonoscopy 10/2016 with one adenomatous polyp Had 5 y recall and she declines Thought her mother had colon cancer-now finds out not  Aunt had it   Disc pros/cons Declines screening at this time but will reach out of she changes her mind       Hyperlipidemia    Disc goals for lipids and reasons to control them Rev last labs with pt Rev low sat fat diet in detail  Good HDL  Stable LDL of 118  Rev cardiology notes and ca score (very low) Reassuring       Routine general medical examination at a health care facility - Primary    Reviewed health habits including diet and exercise and skin cancer prevention Reviewed appropriate screening tests for age  Also reviewed health mt list, fam hx and immunization status , as well as social and family history   See HPI Labs reviewed and ordered Declines 5 y recall colonoscopy  Corrected fam hx -mother did not have colon cancer  Mammogram utd 02/2022 and she already has next one scheduled Dexa utd 06/2022-disc starting alendronate for fx prev No falls or fx and D level is tx No derm issues PHQ score of 0 Low coronary calcium score       Vitamin D deficiency    Vitamin D level is therapeutic with current supplementation Disc importance of this to  bone and overall health Last vitamin D Lab Results  Component Value Date   VD25OH 96.26 12/14/2022   Pt has OP

## 2022-12-22 NOTE — Assessment & Plan Note (Signed)
Pl ct 145 (up) No symptoms Will continue to monitor

## 2022-12-22 NOTE — Patient Instructions (Addendum)
If you change your mind about a colonoscopy please let us know    Your mammogram is due in july If you need an order let us know    For bone health and overall health  Add some strength training to your routine, this is important for bone and brain health and can reduce your risk of falls and help your body use insulin properly and regulate weight  Light weights, exercise bands , and internet videos are a good way to start  Yoga (chair or regular), machines , floor exercises or a gym with machines are also good options    Let's try alendronate for fracture prevention  See the handout  If any side effects stop it and let us know  If you have to have any major dental or jaw procedures-hold it  Will plan a 5 year course if it goes well

## 2022-12-22 NOTE — Assessment & Plan Note (Signed)
Dexa 06/2022  No falls or fx Has OP in forearm  Disc tx opt Will try alendronate weekly (rev poss side eff)  No planned dental work Disc strength building exercise  Discussed vit D intake/ level is tx  Re check at 2 y

## 2023-02-28 DIAGNOSIS — H2513 Age-related nuclear cataract, bilateral: Secondary | ICD-10-CM | POA: Diagnosis not present

## 2023-02-28 DIAGNOSIS — H524 Presbyopia: Secondary | ICD-10-CM | POA: Diagnosis not present

## 2023-03-21 DIAGNOSIS — Z1231 Encounter for screening mammogram for malignant neoplasm of breast: Secondary | ICD-10-CM | POA: Diagnosis not present

## 2023-03-21 LAB — HM MAMMOGRAPHY

## 2023-03-22 ENCOUNTER — Encounter: Payer: Self-pay | Admitting: Family Medicine

## 2023-11-21 ENCOUNTER — Other Ambulatory Visit: Payer: Self-pay | Admitting: Family Medicine

## 2023-12-12 ENCOUNTER — Telehealth: Payer: Self-pay | Admitting: Family Medicine

## 2023-12-12 DIAGNOSIS — D696 Thrombocytopenia, unspecified: Secondary | ICD-10-CM

## 2023-12-12 DIAGNOSIS — E7800 Pure hypercholesterolemia, unspecified: Secondary | ICD-10-CM

## 2023-12-12 DIAGNOSIS — M810 Age-related osteoporosis without current pathological fracture: Secondary | ICD-10-CM

## 2023-12-12 DIAGNOSIS — E559 Vitamin D deficiency, unspecified: Secondary | ICD-10-CM

## 2023-12-12 NOTE — Telephone Encounter (Signed)
-----   Message from Gerry Krone sent at 11/28/2023  3:35 PM EDT ----- Regarding: Lab orders for [facility], 4.17.25 Patient is scheduled for CPX labs, please order future labs, Thanks , Anselmo Kings

## 2023-12-15 ENCOUNTER — Other Ambulatory Visit (INDEPENDENT_AMBULATORY_CARE_PROVIDER_SITE_OTHER)

## 2023-12-15 ENCOUNTER — Telehealth: Payer: Self-pay

## 2023-12-15 DIAGNOSIS — E559 Vitamin D deficiency, unspecified: Secondary | ICD-10-CM | POA: Diagnosis not present

## 2023-12-15 DIAGNOSIS — E7800 Pure hypercholesterolemia, unspecified: Secondary | ICD-10-CM | POA: Diagnosis not present

## 2023-12-15 DIAGNOSIS — D696 Thrombocytopenia, unspecified: Secondary | ICD-10-CM

## 2023-12-15 LAB — CBC WITH DIFFERENTIAL/PLATELET
Basophils Absolute: 0 10*3/uL (ref 0.0–0.1)
Basophils Relative: 0.2 % (ref 0.0–3.0)
Eosinophils Absolute: 0.1 10*3/uL (ref 0.0–0.7)
Eosinophils Relative: 1.5 % (ref 0.0–5.0)
HCT: 41.8 % (ref 36.0–46.0)
Hemoglobin: 13.5 g/dL (ref 12.0–15.0)
Lymphocytes Relative: 43.4 % (ref 12.0–46.0)
Lymphs Abs: 1.9 10*3/uL (ref 0.7–4.0)
MCHC: 32.4 g/dL (ref 30.0–36.0)
MCV: 87.8 fl (ref 78.0–100.0)
Monocytes Absolute: 0.4 10*3/uL (ref 0.1–1.0)
Monocytes Relative: 8.5 % (ref 3.0–12.0)
Neutro Abs: 2.1 10*3/uL (ref 1.4–7.7)
Neutrophils Relative %: 46.4 % (ref 43.0–77.0)
Platelets: 132 10*3/uL — ABNORMAL LOW (ref 150.0–400.0)
RBC: 4.76 Mil/uL (ref 3.87–5.11)
RDW: 14 % (ref 11.5–15.5)
WBC: 4.4 10*3/uL (ref 4.0–10.5)

## 2023-12-15 LAB — COMPREHENSIVE METABOLIC PANEL WITH GFR
ALT: 11 U/L (ref 0–35)
AST: 17 U/L (ref 0–37)
Albumin: 4.4 g/dL (ref 3.5–5.2)
Alkaline Phosphatase: 42 U/L (ref 39–117)
BUN: 12 mg/dL (ref 6–23)
CO2: 29 meq/L (ref 19–32)
Calcium: 9.2 mg/dL (ref 8.4–10.5)
Chloride: 106 meq/L (ref 96–112)
Creatinine, Ser: 0.95 mg/dL (ref 0.40–1.20)
GFR: 57.73 mL/min — ABNORMAL LOW (ref >60.00)
Glucose, Bld: 82 mg/dL (ref 70–99)
Potassium: 4.6 meq/L (ref 3.5–5.1)
Sodium: 141 meq/L (ref 135–145)
Total Bilirubin: 0.5 mg/dL (ref 0.2–1.2)
Total Protein: 6.6 g/dL (ref 6.0–8.3)

## 2023-12-15 LAB — VITAMIN D 25 HYDROXY (VIT D DEFICIENCY, FRACTURES): VITD: 115.01 ng/mL (ref 30.00–100.00)

## 2023-12-15 LAB — LIPID PANEL
Cholesterol: 208 mg/dL — ABNORMAL HIGH (ref 0–200)
HDL: 75.2 mg/dL (ref >39.00)
LDL Cholesterol: 120 mg/dL — ABNORMAL HIGH (ref 0–99)
NonHDL: 133.04
Total CHOL/HDL Ratio: 3
Triglycerides: 65 mg/dL (ref 0.0–149.0)
VLDL: 13 mg/dL (ref 0.0–40.0)

## 2023-12-15 LAB — TSH: TSH: 2.55 u[IU]/mL (ref 0.35–5.50)

## 2023-12-15 NOTE — Telephone Encounter (Signed)
 Call patient... vit D is high... is she taking any vit D supplements? If so stop and increase water intake.  If not.. let me know.  Will forward to PCP for re-evaluation recommendations.

## 2023-12-15 NOTE — Telephone Encounter (Signed)
 Unable to reach patient. Left voicemail to return call to our office.

## 2023-12-15 NOTE — Telephone Encounter (Signed)
 In result note today I advised her to hold the D (if she takes) until our visit Thanks

## 2023-12-15 NOTE — Telephone Encounter (Signed)
 Since this is a holiday weekend coming up, I left a detailed message per DPR with Dr Millie Alm message. Advised that Dr Malissa Se will address further upon her return next week. Asked that if she is not on a supplement  to let us  know.

## 2023-12-15 NOTE — Telephone Encounter (Signed)
 Received call from Saa, with [facility], with critical lab results. States pt's vit D is 115.01.   Fwd to Dr Cherlyn Cornet in Dr Belva Boyden absence.

## 2023-12-16 ENCOUNTER — Other Ambulatory Visit: Payer: Medicare Other

## 2023-12-20 ENCOUNTER — Ambulatory Visit (INDEPENDENT_AMBULATORY_CARE_PROVIDER_SITE_OTHER): Payer: Medicare Other

## 2023-12-20 VITALS — BP 116/66 | Ht 61.0 in | Wt 118.8 lb

## 2023-12-20 DIAGNOSIS — Z0000 Encounter for general adult medical examination without abnormal findings: Secondary | ICD-10-CM

## 2023-12-20 NOTE — Patient Instructions (Addendum)
 Ms. Hakim , Thank you for taking time to come for your Medicare Wellness Visit. I appreciate your ongoing commitment to your health goals. Please review the following plan we discussed and let me know if I can assist you in the future.   Referrals/Orders/Follow-Ups/Clinician Recommendations: none  This is a list of the screening recommended for you and due dates:  Health Maintenance  Topic Date Due   Pap Smear  10/12/2012   COVID-19 Vaccine (2 - 2024-25 season) 05/01/2023   Colon Cancer Screening  12/22/2023*   DTaP/Tdap/Td vaccine (5 - Td or Tdap) 03/06/2024   Mammogram  03/20/2024   Flu Shot  03/30/2024   DEXA scan (bone density measurement)  07/13/2024   Medicare Annual Wellness Visit  12/19/2024   Pneumonia Vaccine  Completed   Hepatitis C Screening  Completed   HPV Vaccine  Aged Out   Meningitis B Vaccine  Aged Out   Zoster (Shingles) Vaccine  Discontinued  *Topic was postponed. The date shown is not the original due date.    Advanced directives: (Declined) Advance directive discussed with you today. Even though you declined this today, please call our office should you change your mind, and we can give you the proper paperwork for you to fill out.  Next Medicare Annual Wellness Visit scheduled for next year: Yes 12/20/24 @ 10:50am in person

## 2023-12-20 NOTE — Telephone Encounter (Signed)
 See results notes, I have been unable to reach pt also

## 2023-12-20 NOTE — Progress Notes (Signed)
 Subjective:   Angelica Rivers is a 78 y.o. who presents for a Medicare Wellness preventive visit.  Visit Complete: In person  Persons Participating in Visit: Patient.  AWV Questionnaire: No: Patient Medicare AWV questionnaire was not completed prior to this visit.  Cardiac Risk Factors include: advanced age (>95men, >29 women);dyslipidemia;sedentary lifestyle     Objective:    Today's Vitals   12/20/23 1042  Weight: 118 lb 12.8 oz (53.9 kg)  Height: 5\' 1"  (1.549 m)   Body mass index is 22.45 kg/m.     12/20/2023   11:00 AM 12/16/2022   10:37 AM 10/21/2021   10:32 AM 04/29/2020    2:57 PM 10/02/2018    3:49 PM 09/27/2017    9:48 AM 11/10/2016   10:37 AM  Advanced Directives  Does Patient Have a Medical Advance Directive? No Yes Yes No No No No  Type of Special educational needs teacher of [place];Living will Healthcare Power of [place];Living will      Does patient want to make changes to medical advance directive?   Yes (MAU/Ambulatory/Procedural Areas - Information given)      Copy of Healthcare Power of Attorney in Chart?  No - copy requested       Would patient like information on creating a medical advance directive?    No - Patient declined No - Patient declined No - Patient declined     Current Medications (verified) Outpatient Encounter Medications as of 12/20/2023  Medication Sig   alendronate  (FOSAMAX ) 70 MG tablet TAKE 1 TABLET(70 MG) BY MOUTH EVERY 7 DAYS WITH A FULL GLASS OF WATER AND ON AN EMPTY STOMACH   amoxicillin  (AMOXIL ) 500 MG capsule TAKE 4 CAPSULES BY MOUTH BEFORE DENTAL APPOINTMENT   aspirin 81 MG chewable tablet Chew 81 mg by mouth every other day.   Flaxseed, Linseed, (FLAX SEED OIL) 1000 MG CAPS Take 1 capsule by mouth daily.    Cholecalciferol (VITAMIN D ) 2000 UNITS CAPS Take 2 capsules by mouth daily. (Patient not taking: Reported on 12/20/2023)   No facility-administered encounter medications on file as of 12/20/2023.    Allergies  (verified) Neosporin [neomycin-bacitracin zn-polymyx] and Shingrix [zoster vac recomb adjuvanted]   History: Past Medical History:  Diagnosis Date   Anemia    History of repair of mitral valve    proph for dentist   Nephrolithiasis    Hx of   Osteopenia    Osteoporosis    fosamax  for over 5 yrs.   Vaginal cyst    stable   Past Surgical History:  Procedure Laterality Date   CARDIAC SURGERY  1995   mitral valve repair carpenters ring   COLONOSCOPY     HEMORRHOID SURGERY     lithotripsy for kidney stones     MITRAL VALVE REPAIR  2004   ruptured ovarian cyst surgery     skin graft dog bite  2010   TONSILLECTOMY     Family History  Problem Relation Age of Onset   Colon cancer Mother    Diabetes Brother    Esophageal cancer Neg Hx    Rectal cancer Neg Hx    Stomach cancer Neg Hx    Social History   Socioeconomic History   Marital status: Married    Spouse name: Not on file   Number of children: Not on file   Years of education: Not on file   Highest education level: Not on file  Occupational History   Not on file  Tobacco  Use   Smoking status: Never   Smokeless tobacco: Never  Vaping Use   Vaping status: Never Used  Substance and Sexual Activity   Alcohol use: No    Alcohol/week: 0.0 standard drinks of alcohol   Drug use: No   Sexual activity: Never  Other Topics Concern   Not on file  Social History Narrative   Not on file   Social Drivers of Health   Financial Resource Strain: Low Risk  (12/20/2023)   Overall Financial Resource Strain (CARDIA)    Difficulty of Paying Living Expenses: Not hard at all  Food Insecurity: No Food Insecurity (12/20/2023)   Hunger Vital Sign    Worried About Running Out of Food in the Last Year: Never true    Ran Out of Food in the Last Year: Never true  Transportation Needs: No Transportation Needs (12/20/2023)   PRAPARE - Administrator, Civil Service (Medical): No    Lack of Transportation (Non-Medical): No   Physical Activity: Inactive (12/20/2023)   Exercise Vital Sign    Days of Exercise per Week: 0 days    Minutes of Exercise per Session: 0 min  Stress: No Stress Concern Present (12/20/2023)   [employer] of Occupational Health - Occupational Stress Questionnaire    Feeling of Stress : Not at all  Social Connections: Moderately Integrated (12/20/2023)   Social Connection and Isolation Panel [NHANES]    Frequency of Communication with Friends and Family: More than three times a week    Frequency of Social Gatherings with Friends and Family: More than three times a week    Attends Religious Services: 1 to 4 times per year    Active Member of [employer] or Organizations: Not on file    Attends Banker Meetings: Never    Marital Status: Married    Tobacco Counseling Counseling given: Not Answered  Clinical Intake:  Pre-visit preparation completed: Yes  Pain : No/denies pain   BMI - recorded: 22.45 Nutritional Status: BMI of 19-24  Normal Nutritional Risks: None Diabetes: No  No results found for: "HGBA1C"   How often do you need to have someone help you when you read instructions, pamphlets, or other written materials from your doctor or pharmacy?: 1 - Never  Interpreter Needed?: No  Comments: lives with husband Information entered by :: B.Davarious Tumbleson,LPN   Activities of Daily Living     12/20/2023   11:00 AM  In your present state of health, do you have any difficulty performing the following activities:  Hearing? 0  Vision? 0  Difficulty concentrating or making decisions? 0  Walking or climbing stairs? 0  Dressing or bathing? 0  Doing errands, shopping? 1  Comment doe snot Engineer, manufacturing and eating ? N  Using the Toilet? N  In the past six months, have you accidently leaked urine? N  Do you have problems with loss of bowel control? N  Managing your Medications? N  Managing your Finances? N  Housekeeping or managing your Housekeeping? N     Patient Care Team: [place], Manley Seeds, MD as PCP - General  Indicate any recent Medical Services you may have received from other than [facility] providers in the past year (date may be approximate).     Assessment:   This is a routine wellness examination for Angelica Rivers.  Hearing/Vision screen Hearing Screening - Comments:: pt says her hearing is good Vision Screening - Comments:: Pt says her vision is good with glasses [employer]  Care   Goals Addressed             This Visit's Progress    DIET - INCREASE WATER INTAKE   On track    12/20/23- I will continue to drink at least 6-8 glasses of water daily.      Patient Stated   On track    12/20/23-, I will continue to maintain and continue medications as prescribed.      Patient Stated   On track    12/20/23-Would like to maintain current routine     COMPLETED: Patient Stated       No new goals       Depression Screen     12/20/2023   10:53 AM 12/16/2022   10:37 AM 10/21/2021   10:36 AM 04/29/2020    3:00 PM 10/02/2018   11:04 AM 09/27/2017    9:46 AM 09/23/2016   11:20 AM  PHQ 2/9 Scores  PHQ - 2 Score 0 0 0 0 0 0 0  PHQ- 9 Score    0 0 0     Fall Risk     12/20/2023   10:48 AM 12/16/2022   10:38 AM 10/21/2021   10:33 AM 04/29/2020    3:00 PM 10/02/2018   11:04 AM  Fall Risk   Falls in the past year? 0 0 0 0 0  Number falls in past yr: 0 0 0 0   Injury with Fall? 0 0 0 0   Risk for fall due to : No Fall Risks No Fall Risks No Fall Risks No Fall Risks   Follow up Falls prevention discussed;Education provided Falls prevention discussed;Falls evaluation completed Falls prevention discussed Falls evaluation completed;Falls prevention discussed     MEDICARE RISK AT HOME:  Medicare Risk at Home Any stairs in or around the home?: Yes If so, are there any without handrails?: Yes Home free of loose throw rugs in walkways, pet beds, electrical cords, etc?: Yes Adequate lighting in your home to reduce risk of falls?: Yes Life alert?:  No Use of a cane, walker or w/c?: No Grab bars in the bathroom?: No (having reinstalled) Shower chair or bench in shower?: Yes Elevated toilet seat or a handicapped toilet?: Yes  TIMED UP AND GO:  Was the test performed?  Yes  Length of time to ambulate 10 feet: 10 sec Gait steady and fast without use of assistive device  Cognitive Function: 6CIT completed    04/29/2020    3:04 PM 10/02/2018   11:05 AM 09/27/2017    9:48 AM 09/23/2016   11:20 AM  MMSE - Mini Mental State Exam  Orientation to time 5 5 5 5   Orientation to Place 5 5 5 5   Registration 3 3 3 3   Attention/ Calculation 5 0 0 0  Recall 3 3 3 3   Language- name 2 objects  0 0 0  Language- repeat 1 1 1 1   Language- follow 3 step command  3 3 3   Language- read & follow direction  0 0 0  Write a sentence  0 0 0  Copy design  0 0 0  Total score  20 20 20         12/20/2023   11:02 AM 12/16/2022   10:39 AM  6CIT Screen  What Year? 0 points 4 points  What month? 0 points 3 points  What time? 0 points 3 points  Count back from 20 0 points 0 points  Months in reverse 0  points 2 points  Repeat phrase 4 points 6 points  Total Score 4 points 18 points    Immunizations Immunization History  Administered Date(s) Administered   19-influenza Whole 06/14/2015   Fluad Quad(high Dose 65+) 05/14/2019, 07/13/2021   Influenza Whole 09/08/2006   Influenza, High Dose Seasonal PF 06/21/2014, 06/23/2016, 06/14/2017, 06/11/2018   Influenza-Unspecified 05/30/2013   Moderna Sars-Covid-2 Vaccination 10/30/2019   Pneumococcal Conjugate-13 12/17/2013   Pneumococcal Polysaccharide-23 10/13/2011   Td 04/20/2002, 02/27/2009   Tdap 12/25/2012, 03/06/2014   Zoster Recombinant(Shingrix) 01/05/2017   Zoster, Live 01/24/2013    Screening Tests Health Maintenance  Topic Date Due   Cervical Cancer Screening (Pap smear)  10/12/2012   COVID-19 Vaccine (2 - 2024-25 season) 05/01/2023   Colonoscopy  12/22/2023 (Originally 11/10/2021)    DTaP/Tdap/Td (5 - Td or Tdap) 03/06/2024   MAMMOGRAM  03/20/2024   INFLUENZA VACCINE  03/30/2024   DEXA SCAN  07/13/2024   Medicare Annual Wellness (AWV)  12/19/2024   Pneumonia Vaccine 29+ Years old  Completed   Hepatitis C Screening  Completed   HPV VACCINES  Aged Out   Meningococcal B Vaccine  Aged Out   Zoster Vaccines- Shingrix  Discontinued    Health Maintenance  Health Maintenance Due  Topic Date Due   Cervical Cancer Screening (Pap smear)  10/12/2012   COVID-19 Vaccine (2 - 2024-25 season) 05/01/2023   Health Maintenance Items Addressed: None needed  Additional Screening:  Vision Screening: Recommended annual ophthalmology exams for early detection of glaucoma and other disorders of the eye.  Dental Screening: Recommended annual dental exams for proper oral hygiene  Community Resource Referral / Chronic Care Management: CRR required this visit?  No   CCM required this visit?  No    Plan:     I have personally reviewed and noted the following in the patient's chart:   Medical and social history Use of alcohol, tobacco or illicit drugs  Current medications and supplements including opioid prescriptions. Patient is not currently taking opioid prescriptions. Functional ability and status Nutritional status Physical activity Advanced directives List of other physicians Hospitalizations, surgeries, and ER visits in previous 12 months Vitals Screenings to include cognitive, depression, and falls Referrals and appointments  In addition, I have reviewed and discussed with patient certain preventive protocols, quality metrics, and best practice recommendations. A written personalized care plan for preventive services as well as general preventive health recommendations were provided to patient.    Nerissa Bannister, LPN   1/61/0960   After Visit Summary: (In Person-Printed) AVS printed and given to the patient  Notes: Nothing significant to report at this  time.

## 2023-12-23 ENCOUNTER — Ambulatory Visit (INDEPENDENT_AMBULATORY_CARE_PROVIDER_SITE_OTHER): Payer: Medicare Other | Admitting: Family Medicine

## 2023-12-23 ENCOUNTER — Encounter: Payer: Self-pay | Admitting: Family Medicine

## 2023-12-23 VITALS — BP 126/72 | HR 73 | Temp 97.7°F | Ht 61.0 in | Wt 118.2 lb

## 2023-12-23 DIAGNOSIS — Z1211 Encounter for screening for malignant neoplasm of colon: Secondary | ICD-10-CM | POA: Diagnosis not present

## 2023-12-23 DIAGNOSIS — M810 Age-related osteoporosis without current pathological fracture: Secondary | ICD-10-CM | POA: Diagnosis not present

## 2023-12-23 DIAGNOSIS — D696 Thrombocytopenia, unspecified: Secondary | ICD-10-CM

## 2023-12-23 DIAGNOSIS — E559 Vitamin D deficiency, unspecified: Secondary | ICD-10-CM

## 2023-12-23 DIAGNOSIS — Z0000 Encounter for general adult medical examination without abnormal findings: Secondary | ICD-10-CM

## 2023-12-23 DIAGNOSIS — E7800 Pure hypercholesterolemia, unspecified: Secondary | ICD-10-CM

## 2023-12-23 NOTE — Patient Instructions (Addendum)
 Stay off the vitamin D  for now Let's re check a level is 6 months   Stay active  Add some strength training to your routine, this is important for bone and brain health and can reduce your risk of falls and help your body use insulin properly and regulate weight  Light weights, exercise bands , and internet videos are a good way to start  Yoga (chair or regular), machines , floor exercises or a gym with machines are also good options    Protein - make sure you get protein with every meal or snack  The following are examples of protein in diet  Meat - chicken/fish/ lean pork  Fish  Eggs  Dairy products  Soy products  Oat milk  Almond milk Nuts and nut butters  Legumes  Dried beans   Take care of yourself

## 2023-12-23 NOTE — Assessment & Plan Note (Signed)
 Disc goals for lipids and reasons to control them Rev last labs with pt Rev low sat fat diet in detail  Good HDL  Stable LDL of 120 Rev cardiology notes and ca score (very low) Reassuring

## 2023-12-23 NOTE — Assessment & Plan Note (Signed)
 Last vitamin D  Lab Results  Component Value Date   VD25OH 115.01 (HH) 12/15/2023   Too high  Was taking 2000 international units D3 daily  Instructed to hold  Will stay off of it and re check level in 6 months

## 2023-12-23 NOTE — Assessment & Plan Note (Signed)
 Lab Results  Component Value Date   WBC 4.4 12/15/2023   HGB 13.5 12/15/2023   HCT 41.8 12/15/2023   MCV 87.8 12/15/2023   PLT 132.0 (L) 12/15/2023   This is stable No new bleeding or bruising

## 2023-12-23 NOTE — Progress Notes (Signed)
 Subjective:    Patient ID: Angelica Rivers, female    DOB: June 20, 1946, 78 y.o.   MRN: [id number]  HPI  Here for health maintenance exam and to review chronic medical problems   Wt Readings from Last 3 Encounters:  12/23/23 118 lb 4 oz (53.6 kg)  12/20/23 118 lb 12.8 oz (53.9 kg)  12/22/22 119 lb 2 oz (54 kg)   22.34 kg/m  Vitals:   12/23/23 1013  BP: 126/72  Pulse: 73  Temp: 97.7 F (36.5 C)  SpO2: 97%    Immunization History  Administered Date(s) Administered   19-influenza Whole 06/14/2015   Fluad Quad(high Dose 65+) 05/14/2019, 07/13/2021   Influenza Whole 09/08/2006   Influenza, High Dose Seasonal PF 06/21/2014, 06/23/2016, 06/14/2017, 06/11/2018   Influenza-Unspecified 05/30/2013   Moderna Sars-Covid-2 Vaccination 10/30/2019   Pneumococcal Conjugate-13 12/17/2013   Pneumococcal Polysaccharide-23 10/13/2011   Td 04/20/2002, 02/27/2009   Tdap 12/25/2012, 03/06/2014   Zoster Recombinant(Shingrix) 01/05/2017   Zoster, Live 01/24/2013    There are no preventive care reminders to display for this patient.   Mammogram 02/2023 Self breast exam-no lumps   Gyn health No problems    Colon cancer screening -colonoscopy 10/2021 with 5 y recall for polyps  She declines   Bone health  Dexa 06/2022  osteoporosis  Taking alendronate  weekly  Falls-none  Fractures-none   Supplements  Last vitamin D  Lab Results  Component Value Date   VD25OH 115.01 (HH) 12/15/2023    Exercise : Busy and active- going to ball games  A lot of housework -heavy duty  Uses some weights also  Not willing to go to the gym (worried about getting sick from other people)      Mood    12/23/2023   10:18 AM 12/20/2023   10:53 AM 12/16/2022   10:37 AM 10/21/2021   10:36 AM 04/29/2020    3:00 PM  Depression screen PHQ 2/9  Decreased Interest 0 0 0 0 0  Down, Depressed, Hopeless 0 0 0 0 0  PHQ - 2 Score 0 0 0 0 0  Altered sleeping 0    0  Tired, decreased energy 0    0  Change  in appetite 0    0  Feeling bad or failure about yourself  0    0  Trouble concentrating 0    0  Moving slowly or fidgety/restless 0    0  Suicidal thoughts 0    0  PHQ-9 Score 0    0  Difficult doing work/chores Not difficult at all    Not difficult at all   Low plt ct  Lab Results  Component Value Date   WBC 4.4 12/15/2023   HGB 13.5 12/15/2023   HCT 41.8 12/15/2023   MCV 87.8 12/15/2023   PLT 132.0 (L) 12/15/2023  Stable    Hyperlipidemia Lab Results  Component Value Date   CHOL 208 (H) 12/15/2023   CHOL 206 (H) 12/14/2022   CHOL 201 (H) 10/28/2021   Lab Results  Component Value Date   HDL 75.20 12/15/2023   HDL 73.90 12/14/2022   HDL 69.70 10/28/2021   Lab Results  Component Value Date   LDLCALC 120 (H) 12/15/2023   LDLCALC 118 (H) 12/14/2022   LDLCALC 118 (H) 10/28/2021   Lab Results  Component Value Date   TRIG 65.0 12/15/2023   TRIG 74.0 12/14/2022   TRIG 65.0 10/28/2021   Lab Results  Component Value Date   CHOLHDL 3 12/15/2023  CHOLHDL 3 12/14/2022   CHOLHDL 3 10/28/2021   Lab Results  Component Value Date   LDLDIRECT 117.7 10/06/2011   LDLDIRECT 127.8 09/22/2010   LDLDIRECT 120.8 09/12/2008   Very low ca score in past  Some burgers and dogs and fries occasionally      Lab Results  Component Value Date   NA 141 12/15/2023   K 4.6 12/15/2023   CO2 29 12/15/2023   GLUCOSE 82 12/15/2023   BUN 12 12/15/2023   CREATININE 0.95 12/15/2023   CALCIUM 9.2 12/15/2023   GFR 57.73 (L) 12/15/2023   EGFR 58 (L) 08/17/2022   GFRNONAA 53 (L) 01/24/2016   Lab Results  Component Value Date   ALT 11 12/15/2023   AST 17 12/15/2023   ALKPHOS 42 12/15/2023   BILITOT 0.5 12/15/2023   Lab Results  Component Value Date   TSH 2.55 12/15/2023        Patient Active Problem List   Diagnosis Date Noted   Encounter for screening mammogram for breast cancer 10/28/2021   Postmenopausal bleeding 05/15/2020   Uterine prolapse 04/30/2020   Estrogen  deficiency 09/15/2015   Colon cancer screening 12/17/2013   Routine general medical examination at a health care facility 12/16/2012   Vitamin D  deficiency 09/23/2009   Thrombocytopenia (HCC) 09/23/2009   Hyperlipidemia 09/12/2008   FIBROCYSTIC BREAST DISEASE 09/08/2007   Osteoporosis 09/08/2007   NEPHROLITHIASIS, HX OF 09/08/2007   Past Medical History:  Diagnosis Date   Anemia    History of repair of mitral valve    proph for dentist   Nephrolithiasis    Hx of   Osteopenia    Osteoporosis    fosamax  for over 5 yrs.   Vaginal cyst    stable   Past Surgical History:  Procedure Laterality Date   CARDIAC SURGERY  1995   mitral valve repair carpenters ring   COLONOSCOPY     HEMORRHOID SURGERY     lithotripsy for kidney stones     MITRAL VALVE REPAIR  2004   ruptured ovarian cyst surgery     skin graft dog bite  2010   TONSILLECTOMY     Social History   Tobacco Use   Smoking status: Never   Smokeless tobacco: Never  Vaping Use   Vaping status: Never Used  Substance Use Topics   Alcohol use: No    Alcohol/week: 0.0 standard drinks of alcohol   Drug use: No   Family History  Problem Relation Age of Onset   Colon cancer Mother    Diabetes Brother    Esophageal cancer Neg Hx    Rectal cancer Neg Hx    Stomach cancer Neg Hx    Allergies  Allergen Reactions   Neosporin [Neomycin-Bacitracin Zn-Polymyx] Other (See Comments)    Festers up   Shingrix [Zoster Vac Recomb Adjuvanted]     Rash    Current Outpatient Medications on File Prior to Visit  Medication Sig Dispense Refill   alendronate  (FOSAMAX ) 70 MG tablet TAKE 1 TABLET(70 MG) BY MOUTH EVERY 7 DAYS WITH A FULL GLASS OF WATER AND ON AN EMPTY STOMACH 4 tablet 11   amoxicillin  (AMOXIL ) 500 MG capsule TAKE 4 CAPSULES BY MOUTH BEFORE DENTAL APPOINTMENT 4 capsule 3   aspirin 81 MG chewable tablet Chew 81 mg by mouth every other day.     Flaxseed, Linseed, (FLAX SEED OIL) 1000 MG CAPS Take 1 capsule by mouth  daily.      No current facility-administered medications on  file prior to visit.    Review of Systems  Constitutional:  Negative for activity change, appetite change, fatigue, fever and unexpected weight change.  HENT:  Negative for congestion, ear pain, rhinorrhea, sinus pressure and sore throat.   Eyes:  Negative for pain, redness and visual disturbance.  Respiratory:  Negative for cough, shortness of breath and wheezing.   Cardiovascular:  Negative for chest pain and palpitations.  Gastrointestinal:  Negative for abdominal pain, blood in stool, constipation and diarrhea.  Endocrine: Negative for polydipsia and polyuria.  Genitourinary:  Negative for dysuria, frequency and urgency.  Musculoskeletal:  Negative for arthralgias, back pain and myalgias.  Skin:  Negative for pallor and rash.  Allergic/Immunologic: Negative for environmental allergies.  Neurological:  Negative for dizziness, syncope and headaches.  Hematological:  Negative for adenopathy. Does not bruise/bleed easily.  Psychiatric/Behavioral:  Negative for decreased concentration and dysphoric mood. The patient is not nervous/anxious.        Objective:   Physical Exam Constitutional:      General: She is not in acute distress.    Appearance: Normal appearance. She is well-developed and normal weight. She is not ill-appearing or diaphoretic.  HENT:     Head: Normocephalic and atraumatic.     Right Ear: Tympanic membrane, ear canal and external ear normal.     Left Ear: Tympanic membrane, ear canal and external ear normal.     Nose: Nose normal. No congestion.     Mouth/Throat:     Mouth: Mucous membranes are moist.     Pharynx: Oropharynx is clear. No posterior oropharyngeal erythema.  Eyes:     General: No scleral icterus.    Extraocular Movements: Extraocular movements intact.     Conjunctiva/sclera: Conjunctivae normal.     Pupils: Pupils are equal, round, and reactive to light.  Neck:     Thyroid : No  thyromegaly.     Vascular: No carotid bruit or JVD.  Cardiovascular:     Rate and Rhythm: Normal rate and regular rhythm.     Pulses: Normal pulses.     Heart sounds: Normal heart sounds.     No gallop.  Pulmonary:     Effort: Pulmonary effort is normal. No respiratory distress.     Breath sounds: Normal breath sounds. No wheezing.     Comments: Good air exch Chest:     Chest wall: No tenderness.  Abdominal:     General: Bowel sounds are normal. There is no distension or abdominal bruit.     Palpations: Abdomen is soft. There is no mass.     Tenderness: There is no abdominal tenderness.     Hernia: No hernia is present.  Genitourinary:    Comments: Breast exam: No mass, nodules, thickening, tenderness, bulging, retraction, inflamation, nipple discharge or skin changes noted.  No axillary or clavicular LA.     Musculoskeletal:        General: No tenderness. Normal range of motion.     Cervical back: Normal range of motion and neck supple. No rigidity. No muscular tenderness.     Right lower leg: No edema.     Left lower leg: No edema.     Comments: No kyphosis   Lymphadenopathy:     Cervical: No cervical adenopathy.  Skin:    General: Skin is warm and dry.     Coloration: Skin is not pale.     Findings: No erythema or rash.     Comments: Solar lentigines diffusely   Neurological:  Mental Status: She is alert. Mental status is at baseline.     Cranial Nerves: No cranial nerve deficit.     Motor: No abnormal muscle tone.     Coordination: Coordination normal.     Gait: Gait normal.     Deep Tendon Reflexes: Reflexes are normal and symmetric. Reflexes normal.  Psychiatric:        Mood and Affect: Mood normal.        Cognition and Memory: Cognition and memory normal.           Assessment & Plan:   Problem List Items Addressed This Visit       Musculoskeletal and Integument   Osteoporosis   Dexa 06/2022  Taking alendronate  and tolerating it well /weekly  No  falls or fractures   D level high-is holding  Discussed fall prevention, supplements and exercise for bone density   Needs more strength building exercise          Hematopoietic and Hemostatic   Thrombocytopenia (HCC)   Lab Results  Component Value Date   WBC 4.4 12/15/2023   HGB 13.5 12/15/2023   HCT 41.8 12/15/2023   MCV 87.8 12/15/2023   PLT 132.0 (L) 12/15/2023   This is stable No new bleeding or bruising         Other   Vitamin D  deficiency   Last vitamin D  Lab Results  Component Value Date   VD25OH 115.01 (HH) 12/15/2023   Too high  Was taking 2000 international units D3 daily  Instructed to hold  Will stay off of it and re check level in 6 months        Relevant Orders   VITAMIN D  25 Hydroxy (Vit-D Deficiency, Fractures)   Routine general medical examination at a health care facility - Primary   Reviewed health habits including diet and exercise and skin cancer prevention Reviewed appropriate screening tests for age  Also reviewed health mt list, fam hx and immunization status , as well as social and family history   See HPI Labs reviewed and ordered Health Maintenance  Topic Date Due   Colon Cancer Screening  12/22/2024*   COVID-19 Vaccine (2 - 2024-25 season) 01/07/2025*   DTaP/Tdap/Td vaccine (5 - Td or Tdap) 03/06/2024   Mammogram  03/20/2024   Flu Shot  03/30/2024   DEXA scan (bone density measurement)  07/13/2024   Medicare Annual Wellness Visit  12/22/2024   Pneumonia Vaccine  Completed   Hepatitis C Screening  Completed   HPV Vaccine  Aged Out   Meningitis B Vaccine  Aged Out   Zoster (Shingles) Vaccine  Discontinued  *Topic was postponed. The date shown is not the original due date.   Declines 5 year colonoscopy  Discussed fall prevention, supplements and exercise for bone density  PHQ 0         Hyperlipidemia   Disc goals for lipids and reasons to control them Rev last labs with pt Rev low sat fat diet in detail  Good HDL   Stable LDL of 120 Rev cardiology notes and ca score (very low) Reassuring       Colon cancer screening   Colonoscopy 2018 with polyps 5 y recall was recommended  Pt declines No stool changes

## 2023-12-23 NOTE — Assessment & Plan Note (Signed)
 Reviewed health habits including diet and exercise and skin cancer prevention Reviewed appropriate screening tests for age  Also reviewed health mt list, fam hx and immunization status , as well as social and family history   See HPI Labs reviewed and ordered Health Maintenance  Topic Date Due   Colon Cancer Screening  12/22/2024*   COVID-19 Vaccine (2 - 2024-25 season) 01/07/2025*   DTaP/Tdap/Td vaccine (5 - Td or Tdap) 03/06/2024   Mammogram  03/20/2024   Flu Shot  03/30/2024   DEXA scan (bone density measurement)  07/13/2024   Medicare Annual Wellness Visit  12/22/2024   Pneumonia Vaccine  Completed   Hepatitis C Screening  Completed   HPV Vaccine  Aged Out   Meningitis B Vaccine  Aged Out   Zoster (Shingles) Vaccine  Discontinued  *Topic was postponed. The date shown is not the original due date.   Declines 5 year colonoscopy  Discussed fall prevention, supplements and exercise for bone density  PHQ 0

## 2023-12-23 NOTE — Assessment & Plan Note (Signed)
 Colonoscopy 2018 with polyps 5 y recall was recommended  Pt declines No stool changes

## 2023-12-23 NOTE — Assessment & Plan Note (Signed)
 Dexa 06/2022  Taking alendronate  and tolerating it well /weekly  No falls or fractures   D level high-is holding  Discussed fall prevention, supplements and exercise for bone density   Needs more strength building exercise

## 2024-01-20 ENCOUNTER — Other Ambulatory Visit: Payer: Self-pay | Admitting: Family Medicine

## 2024-07-25 LAB — HM DEXA SCAN

## 2024-07-25 LAB — HM MAMMOGRAPHY

## 2024-07-30 ENCOUNTER — Ambulatory Visit: Payer: Self-pay | Admitting: Family Medicine

## 2024-07-30 ENCOUNTER — Encounter: Payer: Self-pay | Admitting: Family Medicine

## 2024-09-06 ENCOUNTER — Ambulatory Visit: Payer: Self-pay

## 2024-09-06 NOTE — Telephone Encounter (Signed)
 FYI Only or Action Required?: Action required by provider: update on patient condition.  Patient was last seen in primary care on 12/23/2023 by Randeen Laine LABOR, MD.  Called Nurse Triage reporting Fever and Medication Reaction.  Symptoms began today.  Interventions attempted: Rest, hydration, or home remedies.  Symptoms are: stable.  Triage Disposition: Call PCP Within 24 Hours  Patient/caregiver understands and will follow disposition?: Yes    Copied from CRM #[id number]. Topic: Clinical - Red Word Triage >> Sep 06, 2024  2:10 PM Wess RAMAN wrote: Red Word that prompted transfer to Nurse Triage: Patient's husband, Arlyss, stated patient may have the flu  Symptoms: 99-99.2 fever, chills, no appetite, feels as if she will vomit, fatigue. Received flu shot on 09/04/24     Reason for Disposition  Patient is HIGH RISK (e.g., 65 years and older, pregnant, HIV+, or chronic medical condition)  Additional Information  Commented on: Influenza (Injection; Quadrivalent or Trivalent) injected vaccine reactions    Care advice provided.  Answer Assessment - Initial Assessment Questions Husband calls in, provides initial report of symptoms, patient then joins call. Both deny patient being exposed to anyone with illness recently but are out in public frequently. Husband had Flu A, 12/22, received Tamiflu and felt better quickly.  Celebrated Christmas following weekend.    1. SYMPTOMS: What is the main symptom? (e.g., pain, redness, or swelling at injection site; feeling tired, fever, muscle aches)      Nausea, fatigue, loss of appetite.  Also having fever, chills. Denies any pain. Occasional mild cough.   2. ONSET: When was the vaccine (shot) given? How much later did the symptoms begin? (e.g., hours, days ago)      2 days later; received 1/6, symptoms started today 1/8  3. SEVERITY: How bad is it?      Husband reports symptoms are moderate. Patient reports symptoms are pretty bad, has  been laying down. No current nausea during call, had earlier when trying to eat. Overall feeling better now since laying down.   4. FEVER: Do you have a fever? If Yes, ask: What is your temperature, how was it measured, and when did it start?      99.23F current  5. IMMUNIZATIONS GIVEN: What shots have you recently received?     Received influenza vaccine on 1/6.   6. PAST REACTIONS: Have you reacted to immunizations before? If Yes, ask: What happened?     Reaction to COVID vaccine. Site became red rash, bumps. Provider recommended not to take anymore.  Answer Assessment - Initial Assessment Questions See alternative assessment.  Protocols used: Influenza (Flu) Suspected-A-AH, Immunization Reactions-A-AH

## 2024-09-06 NOTE — Telephone Encounter (Signed)
 Pt said she isn't sure of PCP's question (flu vs. Shot). However she is starting to feel better. She was really nauseous earlier but triage nurse told her to take it easy and eat some crackers instead of a full meal. Pt has been resting and eating crackers and woke up feeling better. She took her temp and it was 99.1 with no med. She said Triage nurse told her she could take tylenol  if needed but felt better and hasn't needed to take it. Pt advise of BRAT diet and to keep resting and if any new sxs start (worsening fever, chills, body aches, cough, or any new sxs) to let us  know.   Sending message to PCP for review

## 2024-09-06 NOTE — Telephone Encounter (Signed)
 Does she think she reacted to the flu shot or she has the flu?

## 2024-09-07 NOTE — Telephone Encounter (Signed)
 Continue resting and drinking fluids  If symptoms worsen or fail to improve follow up for visit Agree with ER/UC precautions

## 2024-12-20 ENCOUNTER — Ambulatory Visit

## 2024-12-21 ENCOUNTER — Ambulatory Visit
